# Patient Record
Sex: Male | Born: 1990 | Race: Black or African American | Hispanic: No | Marital: Single | Smoking: Former smoker
Health system: Southern US, Community
[De-identification: ages and names within clinical notes are randomized; demographics above are authoritative.]

## PROBLEM LIST (undated history)

## (undated) DIAGNOSIS — F419 Anxiety disorder, unspecified: Secondary | ICD-10-CM

## (undated) DIAGNOSIS — R002 Palpitations: Secondary | ICD-10-CM

## (undated) DIAGNOSIS — G4733 Obstructive sleep apnea (adult) (pediatric): Secondary | ICD-10-CM

---

## 2016-01-06 DIAGNOSIS — R002 Palpitations: Secondary | ICD-10-CM

## 2016-01-06 HISTORY — DX: Palpitations: R00.2

## 2016-01-23 ENCOUNTER — Observation Stay (HOSPITAL_COMMUNITY)
Admission: EM | Admit: 2016-01-23 | Discharge: 2016-01-26 | Disposition: A | Discharge status: Home / Self Care | Payer: BLUE CROSS/BLUE SHIELD | Attending: Internal Medicine | Admitting: Internal Medicine

## 2016-01-23 ENCOUNTER — Encounter (HOSPITAL_COMMUNITY): Payer: Self-pay | Admitting: *Deleted

## 2016-01-23 DIAGNOSIS — F419 Anxiety disorder, unspecified: Secondary | ICD-10-CM | POA: Diagnosis present

## 2016-01-23 DIAGNOSIS — R002 Palpitations: Secondary | ICD-10-CM | POA: Diagnosis present

## 2016-01-23 DIAGNOSIS — R55 Syncope and collapse: Secondary | ICD-10-CM | POA: Diagnosis not present

## 2016-01-23 DIAGNOSIS — R079 Chest pain, unspecified: Secondary | ICD-10-CM | POA: Diagnosis present

## 2016-01-23 DIAGNOSIS — N289 Disorder of kidney and ureter, unspecified: Secondary | ICD-10-CM | POA: Diagnosis not present

## 2016-01-23 DIAGNOSIS — R110 Nausea: Secondary | ICD-10-CM | POA: Diagnosis not present

## 2016-01-23 DIAGNOSIS — Z87891 Personal history of nicotine dependence: Secondary | ICD-10-CM | POA: Diagnosis not present

## 2016-01-23 DIAGNOSIS — N179 Acute kidney failure, unspecified: Secondary | ICD-10-CM | POA: Insufficient documentation

## 2016-01-23 DIAGNOSIS — R42 Dizziness and giddiness: Secondary | ICD-10-CM

## 2016-01-23 HISTORY — DX: Palpitations: R00.2

## 2016-01-23 HISTORY — DX: Anxiety disorder, unspecified: F41.9

## 2016-01-23 HISTORY — DX: Obstructive sleep apnea (adult) (pediatric): G47.33

## 2016-01-23 NOTE — ED Provider Notes (Signed)
CSN: [id number]     Arrival date & time 01/23/16  2332 History  By signing my name below, I, Brendan Martinez, attest that this documentation has been prepared under the direction and in the presence of Zadie Rhine, MD. Electronically Signed: Budd Martinez, ED Scribe. 01/24/2016. 12:39 AM.    Chief Complaint  Patient presents with  . Chest Pain   Patient is a 25 y.o. male presenting with chest pain. The history is provided by the patient and medical records. No language interpreter was used.  Chest Pain Pain quality: pressure   Timing:  Constant Chronicity:  New Context: movement   Relieved by:  None tried Worsened by:  Nothing tried Ineffective treatments:  None tried Associated symptoms: altered mental status, diaphoresis, headache, nausea, palpitations, syncope and weakness   Syncope:    Witnessed: yes   Risk factors: male sex    HPI Comments: Level 5 Caveat (AMS) 12:13 AM - Per lab tech, pt was acting normally until he had a syncopal episode just before she was about to collect blood for diagnostic studies. She states that pt responded to sternal rub and regained consciousness. When asked what his birth date was, he simply responded with his first name multiple times, then lost consciousness again.   Brendan Martinez is a 25 y.o. male former smoker with a PMHx of anxiety and heart palpitations who presents to the Emergency Department complaining of chest pain onset tonight. 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 He has had a headache all day, and tonight when he stood up from his desk, he began to feel weak, having palpitations, chest pressure, diaphoresis, and nausea.    Past Medical History  Diagnosis Date  . Anxiety   . Heart palpitations    History reviewed. No pertinent past surgical history. History reviewed. No pertinent family history. Social History  Substance Use Topics  . Smoking status: Former  Games developer  . Smokeless tobacco: None  . Alcohol Use: No    Review of Systems  Unable to perform ROS: Mental status change  Constitutional: Positive for diaphoresis.  Cardiovascular: Positive for chest pain, palpitations and syncope.  Gastrointestinal: Positive for nausea.  Neurological: Positive for syncope, weakness and headaches.    Allergies  Review of patient's allergies indicates no known allergies.  Home Medications   Prior to Admission medications   Not on File   BP 161/84 mmHg  Pulse 99  Temp(Src) 98.2 F (36.8 C) (Oral)  Resp 21  Ht  (1.854 m)  Wt 277 lb (125.646 kg)  BMI 36.55 kg/m2  SpO2 96% Physical Exam CONSTITUTIONAL: ill appearing HEAD: Normocephalic/atraumatic EYES: PERRL ENMT: Mucous membranes moist NECK: supple no meningeal signs SPINE/BACK:entire spine nontender CV: S1/S2 noted, no murmurs/rubs/gallops noted LUNGS: Lungs are clear to auscultation bilaterally, no apparent distress ABDOMEN: soft, nontender NEURO: Pt is breathing spontaneously, however, he does not respond to voice or pain.  He does not respond to sternal rub EXTREMITIES: pulses normal/equal SKIN: warm, color normal PSYCH: unable to asess  ED Course  Procedures  DIAGNOSTIC STUDIES: Oxygen Saturation is 97% on RA, adequate by my interpretation.    COORDINATION OF CARE:  12:18 AM - Discussed plans to order diagnostic studies. Pt advised of plan for treatment and pt agrees. Pt with episode of unresponsiveness Repeat EKG unchanged Unclear cause, however vitals currently appropriate I reviewed tele monitor and no abnormal rhythm noted Will follow closely 12:57  AM Pt more alert He is resting with eyes closed, but will answer questions in a soft voice D/w parents who have arrived They report he has had this before, diagnosed with panic attacks due to stressful job He does not have known cardiac history 2:18 AM Pt awake/alert He does not recall any details from when he was  unresponsive in the ED He reports h/o this in past, though this was longest episode He reports 3 yrs ago while in [place], he had an episode, had a stress test and had echo and was told right side of heart "enlarged" He does appear to have cardiomegaly on today's CXR Will admit for further workup He may benefit from echocardiogram Workup in the ED largely reassuring D/w dr Lovell Sheehan for tele OBS admission BP 140/85 mmHg  Pulse 88  Temp(Src) 98.2 F (36.8 C) (Oral)  Resp 18  Ht  (1.854 m)  Wt 125.646 kg  BMI 36.55 kg/m2  SpO2 96%   Labs Review Labs Reviewed  CBC WITH DIFFERENTIAL/PLATELET - Abnormal; Notable for the following:    WBC 3.7 (*)    Neutro Abs 1.0 (*)    All other components within normal limits  COMPREHENSIVE METABOLIC PANEL - Abnormal; Notable for the following:    Sodium 134 (*)    Chloride 98 (*)    Creatinine, Ser 1.34 (*)    Calcium 8.4 (*)    All other components within normal limits  I-STAT CHEM 8, ED - Abnormal; Notable for the following:    Chloride 95 (*)    Creatinine, Ser 1.40 (*)    All other components within normal limits  TROPONIN I  ETHANOL  URINE RAPID DRUG SCREEN, HOSP PERFORMED  CBG MONITORING, ED  I-STAT TROPOININ, ED  I-STAT CG4 LACTIC ACID, ED    Imaging Review Ct Head Wo Contrast  01/24/2016  CLINICAL DATA:  Headache all day, weakness upon standing. EXAM: CT HEAD WITHOUT CONTRAST TECHNIQUE: Contiguous axial images were obtained from the base of the skull through the vertex without intravenous contrast. COMPARISON:  None. FINDINGS: There is no intracranial hemorrhage, mass or evidence of acute infarction. There is no extra-axial fluid collection. Gray matter and white matter appear normal. Cerebral volume is normal for age. Brainstem and posterior fossa are unremarkable. The CSF spaces appear normal. The bony structures are intact. There is mild membrane thickening in the ethmoid air cells. Remainder of the visible sinuses are  clear. IMPRESSION: Normal brain Electronically Signed   By: Ellery Plunk M.D.   On: 01/24/2016 01:10   Dg Chest Portable 1 View  01/24/2016  CLINICAL DATA:  Acute onset of generalized weakness and altered level of consciousness. Patient unresponsive. Initial encounter. EXAM: PORTABLE CHEST 1 VIEW COMPARISON:  None. FINDINGS: The lungs are well-aerated and clear. There is no evidence of focal opacification, pleural effusion or pneumothorax. The cardiomediastinal silhouette is borderline normal in size. No acute osseous abnormalities are seen. IMPRESSION: No acute cardiopulmonary process seen. Electronically Signed   By: Roanna Raider M.D.   On: 01/24/2016 00:42   I have personally reviewed and evaluated these images and lab results as part of my medical decision-making.   EKG Interpretation   Date/Time:  Sunday January 24 2016 00:11:50 EDT Ventricular Rate:  102 PR Interval:  164 QRS Duration: 97 QT Interval:  332 QTC Calculation: 432 R Axis:   -21 Text Interpretation:  Sinus tachycardia Probable left atrial enlargement  Left ventricular hypertrophy No significant change since last tracing  Confirmed by Bebe ShaggyWICKLINE  MD, Lama Narayanan ([id number]) on 01/24/2016 12:16:36 AM      MDM   Final diagnoses:  Syncope, unspecified syncope type  Renal insufficiency    Nursing notes including past medical history and social history reviewed and considered in documentation xrays/imaging reviewed by myself and considered during evaluation Labs/vital reviewed myself and considered during evaluation   I personally performed the services described in this documentation, which was scribed in my presence. The recorded information has been reviewed and is accurate.       Zadie Rhineonald Jaclynne Baldo, MD 01/24/16 [id number]

## 2016-01-23 NOTE — ED Notes (Addendum)
Pt c/o headache earlier today and tonight he got up from his desk, pt began feeling weak, having heart palpitations, chest pressure, felt diaphoretic, and nauseated. Pt is being treated for anxiety and palpitations but can not take these medications at work.

## 2016-01-24 ENCOUNTER — Observation Stay (HOSPITAL_BASED_OUTPATIENT_CLINIC_OR_DEPARTMENT_OTHER): Payer: BLUE CROSS/BLUE SHIELD

## 2016-01-24 ENCOUNTER — Emergency Department (HOSPITAL_COMMUNITY): Payer: BLUE CROSS/BLUE SHIELD

## 2016-01-24 DIAGNOSIS — R55 Syncope and collapse: Secondary | ICD-10-CM | POA: Diagnosis present

## 2016-01-24 DIAGNOSIS — N179 Acute kidney failure, unspecified: Secondary | ICD-10-CM | POA: Insufficient documentation

## 2016-01-24 DIAGNOSIS — F419 Anxiety disorder, unspecified: Secondary | ICD-10-CM | POA: Diagnosis present

## 2016-01-24 DIAGNOSIS — N289 Disorder of kidney and ureter, unspecified: Secondary | ICD-10-CM

## 2016-01-24 DIAGNOSIS — R072 Precordial pain: Secondary | ICD-10-CM

## 2016-01-24 DIAGNOSIS — R002 Palpitations: Secondary | ICD-10-CM | POA: Diagnosis not present

## 2016-01-24 DIAGNOSIS — R079 Chest pain, unspecified: Secondary | ICD-10-CM | POA: Diagnosis present

## 2016-01-24 DIAGNOSIS — R0789 Other chest pain: Secondary | ICD-10-CM | POA: Diagnosis not present

## 2016-01-24 LAB — CBC
HCT: 43.3 % (ref 39.0–52.0)
Hemoglobin: 14.6 g/dL (ref 13.0–17.0)
MCH: 29.3 pg (ref 26.0–34.0)
MCHC: 33.7 g/dL (ref 30.0–36.0)
MCV: 86.8 fL (ref 78.0–100.0)
PLATELETS: 201 10*3/uL (ref 150–400)
RBC: 4.99 MIL/uL (ref 4.22–5.81)
RDW: 12.5 % (ref 11.5–15.5)
WBC: 3.5 10*3/uL — ABNORMAL LOW (ref 4.0–10.5)

## 2016-01-24 LAB — COMPREHENSIVE METABOLIC PANEL
ALT: 30 U/L (ref 17–63)
AST: 39 U/L (ref 15–41)
Albumin: 3.9 g/dL (ref 3.5–5.0)
Alkaline Phosphatase: 65 U/L (ref 38–126)
Anion gap: 7 (ref 5–15)
BILIRUBIN TOTAL: 1.1 mg/dL (ref 0.3–1.2)
BUN: 16 mg/dL (ref 6–20)
CHLORIDE: 98 mmol/L — AB (ref 101–111)
CO2: 29 mmol/L (ref 22–32)
Calcium: 8.4 mg/dL — ABNORMAL LOW (ref 8.9–10.3)
Creatinine, Ser: 1.34 mg/dL — ABNORMAL HIGH (ref 0.61–1.24)
Glucose, Bld: 96 mg/dL (ref 65–99)
POTASSIUM: 3.6 mmol/L (ref 3.5–5.1)
Sodium: 134 mmol/L — ABNORMAL LOW (ref 135–145)
TOTAL PROTEIN: 7.7 g/dL (ref 6.5–8.1)

## 2016-01-24 LAB — I-STAT CHEM 8, ED
BUN: 15 mg/dL (ref 6–20)
CREATININE: 1.4 mg/dL — AB (ref 0.61–1.24)
Calcium, Ion: 1.12 mmol/L (ref 1.12–1.23)
Chloride: 95 mmol/L — ABNORMAL LOW (ref 101–111)
Glucose, Bld: 94 mg/dL (ref 65–99)
HEMATOCRIT: 47 % (ref 39.0–52.0)
HEMOGLOBIN: 16 g/dL (ref 13.0–17.0)
POTASSIUM: 3.6 mmol/L (ref 3.5–5.1)
SODIUM: 137 mmol/L (ref 135–145)
TCO2: 27 mmol/L (ref 0–100)

## 2016-01-24 LAB — RAPID URINE DRUG SCREEN, HOSP PERFORMED
Amphetamines: NOT DETECTED
BARBITURATES: NOT DETECTED
BENZODIAZEPINES: NOT DETECTED
COCAINE: NOT DETECTED
Opiates: NOT DETECTED
Tetrahydrocannabinol: NOT DETECTED

## 2016-01-24 LAB — BASIC METABOLIC PANEL
Anion gap: 7 (ref 5–15)
BUN: 14 mg/dL (ref 6–20)
CALCIUM: 8.6 mg/dL — AB (ref 8.9–10.3)
CO2: 27 mmol/L (ref 22–32)
CREATININE: 1.13 mg/dL (ref 0.61–1.24)
Chloride: 100 mmol/L — ABNORMAL LOW (ref 101–111)
GFR calc non Af Amer: >60 mL/min (ref >60)
GLUCOSE: 90 mg/dL (ref 65–99)
Potassium: 3.8 mmol/L (ref 3.5–5.1)
Sodium: 134 mmol/L — ABNORMAL LOW (ref 135–145)

## 2016-01-24 LAB — CBG MONITORING, ED: GLUCOSE-CAPILLARY: 89 mg/dL (ref 65–99)

## 2016-01-24 LAB — TSH: TSH: 2.101 u[IU]/mL (ref 0.350–4.500)

## 2016-01-24 LAB — CBC WITH DIFFERENTIAL/PLATELET
Basophils Absolute: 0 10*3/uL (ref 0.0–0.1)
Basophils Relative: 0 %
EOS ABS: 0 10*3/uL (ref 0.0–0.7)
Eosinophils Relative: 0 %
HCT: 42.9 % (ref 39.0–52.0)
Hemoglobin: 14.4 g/dL (ref 13.0–17.0)
LYMPHS ABS: 2.1 10*3/uL (ref 0.7–4.0)
LYMPHS PCT: 56 %
MCH: 29.3 pg (ref 26.0–34.0)
MCHC: 33.6 g/dL (ref 30.0–36.0)
MCV: 87.4 fL (ref 78.0–100.0)
MONO ABS: 0.6 10*3/uL (ref 0.1–1.0)
MONOS PCT: 17 %
Neutro Abs: 1 10*3/uL — ABNORMAL LOW (ref 1.7–7.7)
Neutrophils Relative %: 27 %
PLATELETS: 195 10*3/uL (ref 150–400)
RBC: 4.91 MIL/uL (ref 4.22–5.81)
RDW: 12.5 % (ref 11.5–15.5)
WBC: 3.7 10*3/uL — AB (ref 4.0–10.5)

## 2016-01-24 LAB — TROPONIN I
Troponin I: <0.03 ng/mL (ref <0.031)
Troponin I: <0.03 ng/mL (ref <0.031)

## 2016-01-24 LAB — I-STAT TROPONIN, ED: TROPONIN I, POC: 0 ng/mL (ref 0.00–0.08)

## 2016-01-24 LAB — ECHOCARDIOGRAM COMPLETE
HEIGHTINCHES: 73 in
WEIGHTICAEL: 4352 [oz_av]

## 2016-01-24 LAB — I-STAT CG4 LACTIC ACID, ED: Lactic Acid, Venous: 0.75 mmol/L (ref 0.5–2.0)

## 2016-01-24 LAB — ETHANOL

## 2016-01-24 MED ORDER — ENOXAPARIN SODIUM 40 MG/0.4ML ~~LOC~~ SOLN
40.0000 mg | SUBCUTANEOUS | Status: DC
  Administered 2016-01-25 (×2): 40 mg via SUBCUTANEOUS
  Filled 2016-01-24 (×3): qty 0.4

## 2016-01-24 MED ORDER — ACETAMINOPHEN 650 MG RE SUPP: 650.0000 mg | Freq: Four times a day (QID) | RECTAL | Status: DC | PRN

## 2016-01-24 MED ORDER — OXYCODONE HCL 5 MG PO TABS: 5.0000 mg | ORAL_TABLET | ORAL | Status: DC | PRN

## 2016-01-24 MED ORDER — ALPRAZOLAM 1 MG PO TABS
1.0000 mg | ORAL_TABLET | Freq: Three times a day (TID) | ORAL | Status: DC | PRN
  Administered 2016-01-24: 1 mg via ORAL
  Filled 2016-01-24: qty 1

## 2016-01-24 MED ORDER — SODIUM CHLORIDE 0.9% FLUSH
3.0000 mL | Freq: Two times a day (BID) | INTRAVENOUS | Status: DC
  Administered 2016-01-24 – 2016-01-26 (×4): 3 mL via INTRAVENOUS

## 2016-01-24 MED ORDER — ONDANSETRON HCL 4 MG PO TABS: 4.0000 mg | ORAL_TABLET | Freq: Four times a day (QID) | ORAL | Status: DC | PRN

## 2016-01-24 MED ORDER — HYDROMORPHONE HCL 1 MG/ML IJ SOLN: 0.5000 mg | INTRAMUSCULAR | Status: DC | PRN

## 2016-01-24 MED ORDER — ALUM & MAG HYDROXIDE-SIMETH 200-200-20 MG/5ML PO SUSP: 30.0000 mL | Freq: Four times a day (QID) | ORAL | Status: DC | PRN

## 2016-01-24 MED ORDER — SODIUM CHLORIDE 0.9 % IV SOLN
INTRAVENOUS | Status: AC
  Administered 2016-01-24: 04:00:00 via INTRAVENOUS

## 2016-01-24 MED ORDER — ACETAMINOPHEN 325 MG PO TABS: 650.0000 mg | ORAL_TABLET | Freq: Four times a day (QID) | ORAL | Status: DC | PRN

## 2016-01-24 MED ORDER — IBUPROFEN 400 MG PO TABS: 400.0000 mg | ORAL_TABLET | Freq: Four times a day (QID) | ORAL | Status: DC | PRN

## 2016-01-24 MED ORDER — ONDANSETRON HCL 4 MG/2ML IJ SOLN: 4.0000 mg | Freq: Four times a day (QID) | INTRAMUSCULAR | Status: DC | PRN

## 2016-01-24 NOTE — H&P (Addendum)
[facility] Admission History and Physical       Brendan Martinez ZOX:[id number] DOB: 02-24-1991 DOA: 01/23/2016  Referring physician: EDP PCP: No primary care provider on file.  Specialists:   Chief Complaint: Passed Out  HPI: Brendan Martinez is a 25 y.o. male who was brought to the ED from work after having a syncopal episode.   He reports feeling having a headache and palpitations then beginning to have chest pain and then blacked out.   He suffered another episode in the ED that was witnessed by staff, and no arrhythmias were captured on the monitor at that time.   The EDP reported that he was passed out and unresponsive for a few minutes.    He was not confused afterward.   His initial workup in the ED was negative.    Of Note:  Patient reports having 2 previous episodes of syncope, the first in college in 2013 and he had a Stress test at that time and was told he had a thickened right Ventricle, and he reports that he was told to stop lifting weights.   He denies nay medical problems but he does take Paxil for Anxiety/Panic Attacks.      Review of Systems:  Constitutional: No Weight Loss, No Weight Gain, Night Sweats, Fevers, Chills, Dizziness, Light Headedness, Fatigue, or Generalized Weakness HEENT: +Headache, Difficulty Swallowing,Tooth/Dental Problems,Sore Throat,  No Sneezing, Rhinitis, Ear Ache, Nasal Congestion, or Post Nasal Drip,  Cardio-vascular: +Chest pain, Orthopnea, PND, Edema in Lower Extremities, Anasarca, Dizziness, +Palpitations  Resp: No Dyspnea, No DOE, No Productive Cough, No Non-Productive Cough, No Hemoptysis, No Wheezing.    GI: No Heartburn, Indigestion, Abdominal Pain, Nausea, Vomiting, Diarrhea, Constipation, Hematemesis, Hematochezia, Melena, Change in Bowel Habits,  Loss of Appetite  GU: No Dysuria, No Change in Color of Urine, No Urgency or Urinary Frequency, No Flank pain.  Musculoskeletal: No Joint Pain or Swelling, No Decreased Range of Motion, No Back  Pain.  Neurologic:    +Syncope, No Seizures, Muscle Weakness, Paresthesia, Vision Disturbance or Loss, No Diplopia, No Vertigo, No Difficulty Walking,  Skin: No Rash or Lesions. Psych: No Change in Mood or Affect, No Depression or Anxiety, No Memory loss, No Confusion, or Hallucinations   Past Medical History  Diagnosis Date  . Anxiety   . Heart palpitations      History reviewed. No pertinent past surgical history.    Prior to Admission medications   Not on File     No Known Allergies  Social History:   Works as a Corporate treasurer for [employer]  reports that he has quit smoking. He does not have any smokeless tobacco history on file. He reports that he does not drink alcohol or use illicit drugs.     History reviewed. No pertinent family history.     Physical Exam:  GEN:  Pleasant Well Nourished and Well Developed 25 y.o. male examined and in no acute distress; cooperative with exam Filed Vitals:   01/24/16 0130 01/24/16 0145 01/24/16 0200 01/24/16 0215  BP: 148/86 139/91 134/75 140/85  Pulse: 91 87 87 88  Temp:      TempSrc:      Resp: Height:      Weight:      SpO2: 96% 96% 97% 96%   Blood pressure 140/85, pulse 88, temperature 98.2 F (36.8 C), temperature source Oral, resp. rate 18, height  (1.854 m), weight 125.646 kg (277 lb), SpO2 96 %. PSYCH: He  is alert and oriented x4; does not appear anxious does not appear depressed; affect is normal HEENT: Normocephalic and Atraumatic, Mucous membranes pink; PERRLA; EOM intact; Fundi:  Benign;  No scleral icterus, Nares: Patent, Oropharynx: Clear, Edentulous or Fair Dentition,    Neck:  FROM, No Cervical Lymphadenopathy nor Thyromegaly or Carotid Bruit; No JVD; Breasts:: Not examined CHEST WALL: No tenderness CHEST: Normal respiration, clear to auscultation bilaterally HEART: Regular rate and rhythm; no murmurs rubs or gallops BACK: No kyphosis or scoliosis; No CVA tenderness ABDOMEN:  Positive Bowel Sounds, Soft Non-Tender, No Rebound or Guarding; No Masses, No Organomegaly Rectal Exam: Not done EXTREMITIES: No Cyanosis, Clubbing, or Edema; No Ulcerations. Genitalia: not examined PULSES: 2+ and symmetric SKIN: Normal hydration no rash or ulceration CNS:  Alert and Oriented x 4, No Focal Deficits Vascular: pulses palpable throughout    Labs on Admission:  Basic Metabolic Panel:  Recent Labs Lab 01/24/16 0003 01/24/16 0016  NA 134* 137  K 3.6 3.6  CL 98* 95*  CO2 29  --   GLUCOSE 96 94  BUN 16 15  CREATININE 1.34* 1.40*  CALCIUM 8.4*  --    Liver Function Tests:  Recent Labs Lab 01/24/16 0003  AST 39  ALT 30  ALKPHOS 65  BILITOT 1.1  PROT 7.7  ALBUMIN 3.9   No results for input(s): LIPASE, AMYLASE in the last 168 hours. No results for input(s): AMMONIA in the last 168 hours. CBC:  Recent Labs Lab 01/24/16 0003 01/24/16 0016  WBC 3.7*  --   NEUTROABS 1.0*  --   HGB 14.4 16.0  HCT 42.9 47.0  MCV 87.4  --   PLT 195  --    Cardiac Enzymes:  Recent Labs Lab 01/24/16 0003  TROPONINI <0.03    BNP (last 3 results) No results for input(s): BNP in the last 8760 hours.  ProBNP (last 3 results) No results for input(s): PROBNP in the last 8760 hours.  CBG:  Recent Labs Lab 01/24/16 0018  GLUCAP 89    Radiological Exams on Admission: Ct Head Wo Contrast  01/24/2016  CLINICAL DATA:  Headache all day, weakness upon standing. EXAM: CT HEAD WITHOUT CONTRAST TECHNIQUE: Contiguous axial images were obtained from the base of the skull through the vertex without intravenous contrast. COMPARISON:  None. FINDINGS: There is no intracranial hemorrhage, mass or evidence of acute infarction. There is no extra-axial fluid collection. Gray matter and white matter appear normal. Cerebral volume is normal for age. Brainstem and posterior fossa are unremarkable. The CSF spaces appear normal. The bony structures are intact. There is mild membrane  thickening in the ethmoid air cells. Remainder of the visible sinuses are clear. IMPRESSION: Normal brain Electronically Signed   By: Ellery Plunkaniel R Mitchell M.D.   On: 01/24/2016 01:10   Dg Chest Portable 1 View  01/24/2016  CLINICAL DATA:  Acute onset of generalized weakness and altered level of consciousness. Patient unresponsive. Initial encounter. EXAM: PORTABLE CHEST 1 VIEW COMPARISON:  None. FINDINGS: The lungs are well-aerated and clear. There is no evidence of focal opacification, pleural effusion or pneumothorax. The cardiomediastinal silhouette is borderline normal in size. No acute osseous abnormalities are seen. IMPRESSION: No acute cardiopulmonary process seen. Electronically Signed   By: Roanna RaiderJeffery  Chang M.D.   On: 01/24/2016 00:42     EKG: Independently reviewed. Sinus Tachycardia rate = 103, + LVH Changes        Assessment/Plan:     25 y.o. male with  Principal Problem:  Syncope   Telemetry Monitoring   Check Orthostatics   2D ECHO in AM   CARDs consult in AM     Consider Stress Testing      Active Problems:    Palpitations   Cardiac Monitoring   Check TSH       Chest pain   Cardiac Monitoring   Cycle Troponins    Renal Insufficiency   Gentle IVFs   Monitor BUN/Cr      Anxiety   Continue Paxil Rx    DVT Prophylaxis   Lovenox    Code Status:     FULL CODE        Family Communication:   Both Parents at Bedside    Disposition Plan:   Observation Status with Expected LOS 1-2 days  Time spent:  70 Minutes      Pepper Wyndham C [facility] Pager [phone]   If 7AM -7PM Please Contact the Day Rounding Team MD for [facility]  If 7PM-7AM, Please Contact Night-Floor Coverage  [email or web address] Password [email or web address] 01/24/2016, 2:46 AM     ADDENDUM:   Patient was seen and examined on 01/24/2016

## 2016-01-24 NOTE — ED Notes (Signed)
Family in with pt. Family updated on pt's status.

## 2016-01-24 NOTE — Progress Notes (Signed)
[facility] PROGRESS NOTE    Progress Note   Brendan Martinez RUE:[id number] DOB: [birth year] DOA: 01/23/2016 PCP: No primary care provider on file.   Brief Narrative:   Brendan Martinez is an 25 y.o. male no significant past medical history that comes in for syncopal episode.  Assessment/Plan:   Syncope/Palpitations Twelve-lead EKG shows sinus rhythm with T-wave inversions likely due to LVH. Cardiac biomarkers are negative 3. I have personally reviewed the chest x-ray shows no acute findings. TSH is 2.1. Orthostatics were negative after IV fluid hydration. 2-D echo is pending, I think his syncopal episode was likely an episode of orthostatic hypotension.  Acute kidney injury: Likely  prerenal in etiology resolved with IV fluid hydration.  Anxiety: Benzodiazepines patient is almost comatose, As he got Xanax in the morning.     DVT Prophylaxis - Lovenox ordered.  Family Communication: none Disposition Plan: Home 1-2 days Code Status:     Code Status Orders        Start     Ordered   01/24/16 0315  Full code   Continuous     01/24/16 0315    Code Status History    Date Active Date Inactive Code Status Order ID Comments User Context   This patient has a current code status but no historical code status.        IV Access:    Peripheral IV   Procedures and diagnostic studies:   Ct Head Wo Contrast  01/24/2016  CLINICAL DATA:  Headache all day, weakness upon standing. EXAM: CT HEAD WITHOUT CONTRAST TECHNIQUE: Contiguous axial images were obtained from the base of the skull through the vertex without intravenous contrast. COMPARISON:  None. FINDINGS: There is no intracranial hemorrhage, mass or evidence of acute infarction. There is no extra-axial fluid collection. Gray matter and white matter appear normal. Cerebral volume is normal for age. Brainstem and posterior fossa are unremarkable. The CSF spaces appear normal. The bony structures are intact. There is mild  membrane thickening in the ethmoid air cells. Remainder of the visible sinuses are clear. IMPRESSION: Normal brain Electronically Signed   By: Ellery Plunkaniel R Mitchell M.D.   On: 01/24/2016 01:10   Dg Chest Portable 1 View  01/24/2016  CLINICAL DATA:  Acute onset of generalized weakness and altered level of consciousness. Patient unresponsive. Initial encounter. EXAM: PORTABLE CHEST 1 VIEW COMPARISON:  None. FINDINGS: The lungs are well-aerated and clear. There is no evidence of focal opacification, pleural effusion or pneumothorax. The cardiomediastinal silhouette is borderline normal in size. No acute osseous abnormalities are seen. IMPRESSION: No acute cardiopulmonary process seen. Electronically Signed   By: Roanna RaiderJeffery  Chang M.D.   On: 01/24/2016 00:42     Medical Consultants:    None.  Anti-Infectives:   Anti-infectives    None      Subjective:    Brendan Martinez unable to provide a history as a patient is heavily sedated.  Objective:    Filed Vitals:   01/24/16 0245 01/24/16 0315 01/24/16 0322 01/24/16 0805  BP: 138/83  125/69 124/67  Pulse: 80  82 68  Temp:   98.9 F (37.2 C) 98.1 F (36.7 C)  TempSrc:   Oral Oral  Resp: 19  18 16   Height:   6\' 1"  (1.854 m)   Weight:   123.378 kg (272 lb)   SpO2: 95% 95% 95% 95%    Intake/Output Summary (Last 24 hours) at 01/24/16 0957 Last data filed at 01/24/16 0924  Gross per 24 hour  Intake    240 ml  Output      0 ml  Net    240 ml   Filed Weights   01/23/16 2340 01/24/16 0322  Weight: 125.646 kg (277 lb) 123.378 kg (272 lb)    Exam: Gen:  NAD, heavily sedated unable to provide history. Cardiovascular:  RRR. Chest and lungs:   CTAB Abdomen:  Abdomen soft, NT/ND, + BS Extremities:  No edema   Data Reviewed:    Labs: Basic Metabolic Panel:  Recent Labs Lab 01/24/16 0003 01/24/16 0016 01/24/16 0403  NA 134* 137 134*  K 3.6 3.6 3.8  CL 98* 95* 100*  CO2 29  --  27  GLUCOSE 96 94 90  BUN CREATININE  1.34* 1.40* 1.13  CALCIUM 8.4*  --  8.6*   GFR Estimated Creatinine Clearance: 137.5 mL/min (by C-G formula based on Cr of 1.13). Liver Function Tests:  Recent Labs Lab 01/24/16 0003  AST 39  ALT 30  ALKPHOS 65  BILITOT 1.1  PROT 7.7  ALBUMIN 3.9   No results for input(s): LIPASE, AMYLASE in the last 168 hours. No results for input(s): AMMONIA in the last 168 hours. Coagulation profile No results for input(s): INR, PROTIME in the last 168 hours.  CBC:  Recent Labs Lab 01/24/16 0003 01/24/16 0016 01/24/16 0403  WBC 3.7*  --  3.5*  NEUTROABS 1.0*  --   --   HGB 14.4 16.0 14.6  HCT 42.9 47.0 43.3  MCV 87.4  --  86.8  PLT 195  --  201   Cardiac Enzymes:  Recent Labs Lab 01/24/16 0003 01/24/16 0411  TROPONINI <0.03 <0.03   BNP (last 3 results) No results for input(s): PROBNP in the last 8760 hours. CBG:  Recent Labs Lab 01/24/16 0018  GLUCAP 89   D-Dimer: No results for input(s): DDIMER in the last 72 hours. Hgb A1c: No results for input(s): HGBA1C in the last 72 hours. Lipid Profile: No results for input(s): CHOL, HDL, LDLCALC, TRIG, CHOLHDL, LDLDIRECT in the last 72 hours. Thyroid function studies:  Recent Labs  01/24/16 0403  TSH 2.101   Anemia work up: No results for input(s): VITAMINB12, FOLATE, FERRITIN, TIBC, IRON, RETICCTPCT in the last 72 hours. Sepsis Labs:  Recent Labs Lab 01/24/16 0003 01/24/16 0020 01/24/16 0403  WBC 3.7*  --  3.5*  LATICACIDVEN  --  0.75  --    Microbiology No results found for this or any previous visit (from the past 240 hour(s)).   Medications:   . enoxaparin (LOVENOX) injection  40 mg Subcutaneous Q24H  . sodium chloride flush  3 mL Intravenous Q12H   Continuous Infusions: . sodium chloride 100 mL/hr at 01/24/16 0405    Time spent: 25 min     FELIZ Rosine Beat  [facility] Pager [phone]  *Please refer to [email or web address], password [email or web address] to get updated schedule on who will round on this  patient, as hospitalists switch teams weekly. If 7PM-7AM, please contact night-coverage at [email or web address], password [email or web address] for any overnight needs.  01/24/2016, 9:57 AM

## 2016-01-24 NOTE — Progress Notes (Signed)
*  PRELIMINARY RESULTS* Echocardiogram 2D Echocardiogram has been performed.  Bjorn Hallas H Chayden Garrelts 01/24/2016, 10:40 AM 

## 2016-01-24 NOTE — ED Notes (Signed)
Hospitalist in with pt

## 2016-01-24 NOTE — Progress Notes (Signed)
[id number] Patient noted anxious and c/o heart racing. Patient takes Xanax at home for anxiety. No current order for anti-anxiety medication noted at this time. MD notified and order for anti-anxiety medication requested.

## 2016-01-24 NOTE — ED Notes (Signed)
Pt stating, "My heart, I'm fading." "My heart is stopping." I assured pt that we were monitoring him and his heart had not stopped. Pt feeling weak and still diaphoretic. Cold wash cloth placed on pt's forehead.

## 2016-01-24 NOTE — ED Notes (Signed)
Family at bedside. 

## 2016-01-24 NOTE — ED Notes (Signed)
Pt sitting up in bed, alert, awake, speaking clearly. Pt has no complaints at this time. Pt does ask if Sgt. Stultz calls again to check on him, to direct the phone call to his room.

## 2016-01-24 NOTE — ED Notes (Signed)
Lab tech Garfield CorneaGwyn was in room drawing pt's blood and called out for help. Upon entry to room pt was mildy responsive to sternal rub and was diaphoretic. EDP wickline was called to room.

## 2016-01-24 NOTE — Progress Notes (Signed)
Patient began having a panic attack according to his family. He was trembling and hyperventilating in his bed and stated, "I'm going to pass out," multiple times. Heart rate was in the 120's. Paged Dr. Robb Matarrtiz.  Since the patient had been comatose after taking a xanax this morning around 5:30am, MD decided to continue monitoring pt and not to give another dose of xanax.  Continuing to monitor vitals.

## 2016-01-25 ENCOUNTER — Observation Stay (HOSPITAL_COMMUNITY): Payer: BLUE CROSS/BLUE SHIELD

## 2016-01-25 ENCOUNTER — Encounter (HOSPITAL_COMMUNITY): Payer: Self-pay | Admitting: Adult Health

## 2016-01-25 DIAGNOSIS — R0789 Other chest pain: Secondary | ICD-10-CM

## 2016-01-25 DIAGNOSIS — N179 Acute kidney failure, unspecified: Secondary | ICD-10-CM

## 2016-01-25 DIAGNOSIS — R002 Palpitations: Secondary | ICD-10-CM

## 2016-01-25 DIAGNOSIS — F419 Anxiety disorder, unspecified: Secondary | ICD-10-CM

## 2016-01-25 DIAGNOSIS — R55 Syncope and collapse: Secondary | ICD-10-CM

## 2016-01-25 MED ORDER — LORAZEPAM 1 MG PO TABS
1.0000 mg | ORAL_TABLET | Freq: Once | ORAL | Status: AC | PRN
  Administered 2016-01-26: 1 mg via ORAL
  Filled 2016-01-25: qty 1

## 2016-01-25 MED ORDER — METOPROLOL TARTRATE 25 MG PO TABS
12.5000 mg | ORAL_TABLET | Freq: Two times a day (BID) | ORAL | Status: DC
  Administered 2016-01-25 – 2016-01-26 (×2): 12.5 mg via ORAL
  Filled 2016-01-25 (×2): qty 1

## 2016-01-25 MED ORDER — POTASSIUM CHLORIDE IN NACL 20-0.9 MEQ/L-% IV SOLN
INTRAVENOUS | Status: AC
  Administered 2016-01-25: 15:00:00 via INTRAVENOUS

## 2016-01-25 MED ORDER — SODIUM CHLORIDE 0.9 % IV SOLN: INTRAVENOUS | Status: DC

## 2016-01-25 NOTE — Consult Note (Signed)
CARDIOLOGY CONSULT NOTE   Patient ID: Brendan Martinez MRN: [id number] DOB/AGE: 01-Sep-1991 24 y.o.  Admit Date: 01/23/2016 Referring Physician: TRH=Buriev MD Primary Physician: No primary care provider on file. Consulting Cardiologist: Dina Rich MD Primary Cardiologist: New Reason for Consultation: Syncope and Palpitations   Clinical Summary Mr. Brendan Martinez is a 25 y.o.male who was admitted through the ER with episode of syncope. He was found to have mild renal insufficiency with creatinine of 1.40.   He had one episode 3 years ago, after playing football in college. Seen in [place] where he had a stress test and an echo, [facility]) which he states was normal but was told he had an enlarged heart.  He states symptoms began again 3 months ago. He awoke from sleep feeling like he was "fadding out" and felt his HR elevated. He was seen in [place] and diagnosed with panic attacks, given antianxiety medication and released.   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 He began to feel very dizzy and lightheaded. Called for help and EMS was summoned. He states that he continued to feel his HR racing and was told that his BP was elevated. No EMS documentation is available to review.   On arrival to ER, BP 136/66, HR 96, afebrile. EKG NSR with LVH. Potassium of Na of 136, Potassium of 3.6, Creatinine of 1.34. WBC 3.7. CT of brain negative for acute abnormalities. CXR negative for acute cardiopulmonary abnormalities. He had an episode of unresponsiveness with no changes on telemetry, but responded to verbal stimuli and sternal rub.   Yesterday afternoon, he was in bed when the same symptoms occurred again, with review of telemetry demonstrating sinus tachycardia rate of 119-120 bpm.  He was seen by his nurse who helped him to slow down his breathing and relax. He was given a Xanax.    No Known Allergies  Medications Scheduled Medications: . enoxaparin (LOVENOX) injection  40 mg Subcutaneous Q24H  . sodium chloride flush  3 mL Intravenous Q12H      PRN Medications: acetaminophen **OR** acetaminophen, alum & mag hydroxide-simeth, ibuprofen, ondansetron **OR** ondansetron (ZOFRAN) IV   Past Medical History  Diagnosis Date  . Anxiety   . Heart palpitations   . OSA (obstructive sleep apnea)     History reviewed. No pertinent past surgical history.  Family History  Problem Relation Age of Onset  . Arrhythmia Sister   . Diabetes Father   . Hypertension Mother     Social History Mr. Brendan Martinez reports that he has quit smoking. He does not have any smokeless tobacco history on file. Mr. Brendan Martinez reports that he does not drink alcohol.  Review of Systems Complete review of systems are found to be negative unless outlined in H&P above.  Physical Examination Blood pressure 128/89, pulse 77, temperature 97.9 F (36.6 C), temperature source Oral, resp. rate 20, height  (1.854 m), weight 272 lb (123.378 kg), SpO2 99 %.  Intake/Output Summary (Last 24 hours) at 01/25/16 1032 Last data filed at 01/25/16 0700  Gross per 24 hour  Intake    120 ml  Output   2700 ml  Net  -2580 ml    Telemetry: NSR with two episodes of Sinus tachycardia with rates of 119 bpm-120 bpm 4 pm 01/24/2016.  GEN: No acute distress HEENT: Conjunctiva and lids normal, oropharynx clear with moist mucosa. Neck: Supple, no elevated JVP or carotid bruits, no thyromegaly. Lungs: Clear to auscultation, nonlabored breathing at rest. Cardiac: Regular rate and rhythm, no S3 or significant systolic murmur, no pericardial rub. Abdomen: Soft, nontender, no hepatomegaly, bowel sounds present, no guarding or rebound. Extremities: No pitting edema, distal pulses 2+. Skin: Warm and  dry. Musculoskeletal: No kyphosis. Neuropsychiatric: Alert and oriented x3, affect grossly appropriate.  Prior Cardiac Testing/Procedures 1.Echocardiogram [facility] 11/19/2012) Left Ventricle: The left ventricle was normal in size with mild concentric hypertrophy. Left Ventricle The left ventricular systolic function was normal. Function: Ejection fraction was 55%. Normal diastolic function. Tissue Doppler 13 cm/sec, E/E'=7. Left Atrium: The left atrium was normal in size. Estimated mean pressure 11 mmHg. Right Ventricle: The right ventricle was normal in size and function. TAPSE is 2.3 cm. Right Atrium: The right atrium was normal in size. Mitral Valve: The mitral leaflets were normal. Trace regurgitation. Aortic Valve: The aortic valve leaflets were normal. Tricuspid Valve: The tricuspid valve was structurally normal. Trace tricuspid regurgitation. Normal pulmonary artery pressure of 26 mmHg. Pulmonic Valve: The pulmonic valve was structurally normal. Mild pulmonic regurgitation. End diastolic pressure 11 mmHg. Aorta: Normal aortic root diameter. Pericardium: The pericardium was normal.  2. Stress Test [facility] 11/16/2012) EKG Analysis Resting EKG: Normal sinus rhythm  Exercise EKG:  No ST segment changes. Arrhythmias: None EKG Interpretation: Negative EKG response. Echocardiograpic Analysis Image Quality: adequate  Wall Segments Baseline Immediately Post Stress Basal Anteroseptal Normal Hyperdynamic Anterior Normal Hyperdynamic Anterolateral Normal Hyperdynamic Inferolateral Normal Hyperdynamic Inferior Normal Hyperdynamic Inferoseptal Normal Hyperdynamic Mid  Anteroseptal Normal Hyperdynamic Anterior Normal Hyperdynamic Anterolateral Normal Hyperdynamic Inferolateral Normal Hyperdynamic Inferior Normal Hyperdynamic Inferoseptal Normal Hyperdynamic Apical  Anterior Normal Hyperdynamic Lateral Normal Hyperdynamic Inferior Normal  Hyperdynamic Septal Normal Hyperdynamic  Cap Normal Hyperdynamic  LV Chamber Size Normal Smaller  Global Systolic Function Normal Hyperdynamic   1.Echocardiogram: 01/24/2016 Left ventricle: The cavity size was normal. Wall thickness was  increased in a pattern of moderate LVH. There was severe focal  basal hypertrophy of the septum. Systolic function was normal.  The estimated ejection fraction was in the range of 55% to 60%.  Wall motion was normal; there were no regional wall motion  abnormalities. Left ventricular diastolic function parameters  were normal. - Aortic valve: Structurally normal valve. Trileaflet. - Mitral valve: Mildly thickened leaflets . There was no  significant regurgitation. - Left atrium: The atrium was normal in size. - Right ventricle: The cavity size was mildly dilated. Systolic  function was normal. - Right atrium: The atrium was mildly dilated. - Tricuspid valve: There was mild regurgitation. - Pulmonary arteries: PA peak pressure: 21 mm Hg (S). - Inferior vena cava: The vessel was normal in size. The  respirophasic diameter changes were in the normal range (>= 50%),  consistent with normal central venous pressure.  Lab Results  Basic Metabolic Panel:  Recent Labs Lab 01/24/16 0003 01/24/16 0016 01/24/16 0403  NA 134* 137 134*  K 3.6 3.6 3.8  CL 98* 95* 100*  CO2 29  --  27  GLUCOSE 96 94 90  BUN 16 15 14   CREATININE 1.34* 1.40* 1.13  CALCIUM 8.4*  --  8.6*    Liver Function Tests:  Recent Labs Lab 01/24/16 0003  AST 39  ALT 30  ALKPHOS 65  BILITOT 1.1  PROT 7.7  ALBUMIN 3.9    CBC:  Recent  Labs Lab 01/24/16 0003 01/24/16 0016 01/24/16 0403  WBC 3.7*  --  3.5*  NEUTROABS 1.0*  --   --   HGB 14.4 16.0 14.6  HCT 42.9 47.0 43.3  MCV 87.4  --  86.8  PLT 195  --  201    Cardiac Enzymes:  Recent Labs Lab 01/24/16 0003 01/24/16 0411 01/24/16 0905 01/24/16 1506  TROPONINI <0.03 <0.03 <0.03 <0.03     Radiology: Ct Head Wo Contrast  01/24/2016  CLINICAL DATA:  Headache all day, weakness upon standing. EXAM: CT HEAD WITHOUT CONTRAST TECHNIQUE: Contiguous axial images were obtained from the base of the skull through the vertex without intravenous contrast. COMPARISON:  None. FINDINGS: There is no intracranial hemorrhage, mass or evidence of acute infarction. There is no extra-axial fluid collection. Gray matter and white matter appear normal. Cerebral volume is normal for age. Brainstem and posterior fossa are unremarkable. The CSF spaces appear normal. The bony structures are intact. There is mild membrane thickening in the ethmoid air cells. Remainder of the visible sinuses are clear. IMPRESSION: Normal brain Electronically Signed   By: Ellery Plunk M.D.   On: 01/24/2016 01:10   Dg Chest Portable 1 View  01/24/2016  CLINICAL DATA:  Acute onset of generalized weakness and altered level of consciousness. Patient unresponsive. Initial encounter. EXAM: PORTABLE CHEST 1 VIEW COMPARISON:  None. FINDINGS: The lungs are well-aerated and clear. There is no evidence of focal opacification, pleural effusion or pneumothorax. The cardiomediastinal silhouette is borderline normal in size. No acute osseous abnormalities are seen. IMPRESSION: No acute cardiopulmonary process seen. Electronically Signed   By: Roanna Raider M.D.   On: 01/24/2016 00:42     ECG: Sinus tachycardia rate of 106 bpm. No evidence of WPW.    Impression and Recommendations  1. Tachycardia: Two episodes of elevated heart rate 119 -120 yesterday afternoon at rest. Symptomatic with lightheadedness, chest pressure and dizziness. Relieved with Xanax and relaxation breathing. Echo demonstrates moderate LVH. There was severe focal basal hypertrophy of the septum with EF of 55%-60%.  TSH is normal. Currently NSR rates in the 70's, with no further episodes. Potassium is improved. Consider low dose metoprolol 12.5 mg BID for heart rate  control. Monitor as OP for ongoing evaluation.   2. Syncope: Associated with elevated HR. Has had two episodes over the last 3 months.   3. Hx of Panic Attacks: He has not been followed by psychiatry nor is he seeing a Veterinary surgeon. Consider this as OP if clinically necessary.    Signed: Bettey Mare. Lawrence NP [facility]  01/25/2016, 10:32 AM Co-Sign MD Patient seen and discussed with NP Lyman Bishop, I agree with her documentation. 25 yo male history of anxiety and heart palpitations admitted with syncope. Episode occurred at work followed by repeat episode in ER. Reports prior history of syncope in 2013 with negative stress test at that time.   ER Vitals: p 96 bp 136/66 97% RA Orthostatics negative K 3.6 Cr 1.34-->1.4-->1.13 Hgb 14.4 Plt 195  UDS neg  Trop x 4 neg TSH 2.1  CT head no acute process CXR no acute process EKG NSR, normal PR, normal QTc, LVH Echo 01/24/16: LVEF 55-60%, no WMAs, mildly dilated RV  11/2012 [facility] echo: LVEF 55%, mild concentric LVH 11/2012 [facility] stress echo: stage IV Bruce , 97% THR, negative for iscehmia. No arrhythmias. Outflow gradient at rest 4, increased to 19 with exercise.  2013 Holter results not available on Care everywhere  Approximaetly 4 episodes of  syncope since 2013. First episode in 2013 occurred during a break at football practice. Patient sat down to drink from water bottle and suddenly felt his heart racing, + chest pain, + lightheaded. Went to stand up and passed out. He is not sure how long he was out for. It was after that episode that he was worked up at [employer] without significant findings including echo, stress echo. There was some mentioning of RV wall thickening from that evaluation. Holter monitor was done but results not available at this time. Second episode Dec 2016. He awoke from sleep with sudden palpitations, felt lightheaded. Tried to get to his feet but loss consciouness. 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 Repeat episode once he got to ER, from nursing note telemetry at that time appeared normal. He does report intermittent episodes of palpitations as well, often in the cool down periods after exercise.He does have history of anxiety and is worried these may be panic attacks. No family history of heart disease or early cardiac death.   Unclear etiology of syncope. He does have focal basal septal hypetrtophy measuring 1.5 cm, with a fairly normal rest of his anteroseptal wall and no dynamic gradient. Odd finding given his young age and lack of HTN, concern would be for focal hypertrophic CM. He also looks to have some RV enlargement, the RV free wall is poorly visualized but from workup 2013 there was some concern about RVH. Given recurrent episodes of syncope in otherwise young patient will arrange outpatient cardiac MRI to look for possible focal hypertrophic CM or ARVD, we will also arrange to repeat an outpatient monitor x 30 days and have EP follow up. If no recurrent episodes and stable tele would be ok for discharge later today. Advised to limit workouts, would suggest 2 weeks work absence to allow further workup and monitoring for recurrence of symptoms, he works as prison guard and episodes can put him at risk.    Dominga Ferry MD

## 2016-01-25 NOTE — Progress Notes (Signed)
Patient had an episode of anxiety, increased breathing and HR up to 98. Pt was sweaty and crying. No PRN medications available. Essential oils given. MD notified and aware. MD ordered no PRN medication due to possibility of making pt too sedated. Ordered to monitor and make MD aware if episode happens again. RN will continue to monitor. Lesly Dukesachel J Everett, RN

## 2016-01-25 NOTE — Progress Notes (Signed)
[id number] Patient reported feeling like his heart was racing and noted anxious. Patient instructed to breathe in through his nose and out through his mouth slowly with control. Spoke with patient to distract him from becoming more anxious and showed him his telemetry monitor that showed his pulse at 83. Patient requested anxiety medication and was informed that he no longer had a current order for an anti-anxiety medication d/t patient appearing "comatose" yesterday morning. Patient reported that he didn't recall anyone trying to wake him up yesterday morning and he had been awake all night and after receiving his anti-anxiety medication that was the best he had slept and rested. 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

## 2016-01-25 NOTE — Progress Notes (Signed)
PAtient reports feeling dizzy, but not sure if he just isn't very tired.  I told him to rest his eyes and see if that helped and let me know if it didn't.  BP is 118/55.  MD notified.  Will carry out any orders received.  RN will continue to monitor patient and report any changes to MD.

## 2016-01-25 NOTE — Progress Notes (Signed)
Telemetry reviewed later in the day. No arrhythmias noted. We will arrange outpatient cardiac MRI, 21 day event monitor, and outpatient EP referral. No further cardiac testing planned. Will start lopressor 12.5mg  bid to help with symptoms. Ok for discharge from cardiac standpoint today.   Dominga FerryJ Channie Bostick MD

## 2016-01-25 NOTE — Progress Notes (Signed)
[facility] PROGRESS NOTE  Zannie Runkle YNW:[id number] DOB: 12/08/1990 DOA: 01/23/2016 PCP: No primary care provider on file.  Assessment/Plan: 25 y/o male with remote syncope  presented with episode of syncope.   1. Syncope of unclear etiology. Patient reports h/o syncope. This time: he developed syncope after having sudden headache, pressure, which he states lowered into his chest, described as squeezing, with associated dyspnea and racing HR -echo: LVEF 55-60%. Wall thickness was  increased in a pattern of moderate LVH. There was severe focal  basal hypertrophy of the septum. Systolic function was normal.  The estimated ejection fraction was in the range of 55% to 60%.  Wall motion was normal; there were no regional wall motion  abnormalities. Left ventricular diastolic function parameters  were normal. -TSH-2.1. Check 24 hrs urine cathecholamine, metanephrine,  aslo IGF1. Carotid US. Cardiology plans: outpatient cardiac MRI  2. Anxiety. Apparently, patient is very sensitive to benzo. Will try to avoid   3. AKI. resolving with IVF   Code Status: full Family Communication: d/w patient, his parents (indicate person spoken with, relationship, and if by phone, the number) Disposition Plan: home 24-48 hrs    Consultants:  Cardiology   Procedures:  echo  Antibiotics:  none (indicate start date, and stop date if known)  HPI/Subjective: anxiety  Objective: Filed Vitals:   01/24/16 2300 01/25/16 0630  BP: 128/89   Pulse: 77   Temp: 98.6 F (37 C) 97.9 F (36.6 C)  Resp: 16 20    Intake/Output Summary (Last 24 hours) at 01/25/16 1413 Last data filed at 01/25/16 0700  Gross per 24 hour  Intake      0 ml  Output   2200 ml  Net  -2200 ml   Filed Weights   01/23/16 2340 01/24/16 0322  Weight: 125.646 kg (277 lb) 123.378 kg (272 lb)    Exam:   General:  Alert, no distress   Cardiovascular: s1,s2 rrr  Respiratory: CTA BL   Abdomen: soft, nt,nd    Musculoskeletal: no leg edema    Data Reviewed: Basic Metabolic Panel:  Recent Labs Lab 01/24/16 0003 01/24/16 0016 01/24/16 0403  NA 134* 137 134*  K 3.6 3.6 3.8  CL 98* 95* 100*  CO2 29  --  27  GLUCOSE 96 94 90  BUN CREATININE 1.34* 1.40* 1.13  CALCIUM 8.4*  --  8.6*   Liver Function Tests:  Recent Labs Lab 01/24/16 0003  AST 39  ALT 30  ALKPHOS 65  BILITOT 1.1  PROT 7.7  ALBUMIN 3.9   No results for input(s): LIPASE, AMYLASE in the last 168 hours. No results for input(s): AMMONIA in the last 168 hours. CBC:  Recent Labs Lab 01/24/16 0003 01/24/16 0016 01/24/16 0403  WBC 3.7*  --  3.5*  NEUTROABS 1.0*  --   --   HGB 14.4 16.0 14.6  HCT 42.9 47.0 43.3  MCV 87.4  --  86.8  PLT 195  --  201   Cardiac Enzymes:  Recent Labs Lab 01/24/16 0003 01/24/16 0411 01/24/16 0905 01/24/16 1506  TROPONINI <0.03 <0.03 <0.03 <0.03   BNP (last 3 results) No results for input(s): BNP in the last 8760 hours.  ProBNP (last 3 results) No results for input(s): PROBNP in the last 8760 hours.  CBG:  Recent Labs Lab 01/24/16 0018  GLUCAP 89    No results found for this or any previous visit (from the past 240 hour(s)).   Studies: Ct Head  Wo Contrast  01/24/2016  CLINICAL DATA:  Headache all day, weakness upon standing. EXAM: CT HEAD WITHOUT CONTRAST TECHNIQUE: Contiguous axial images were obtained from the base of the skull through the vertex without intravenous contrast. COMPARISON:  None. FINDINGS: There is no intracranial hemorrhage, mass or evidence of acute infarction. There is no extra-axial fluid collection. Gray matter and white matter appear normal. Cerebral volume is normal for age. Brainstem and posterior fossa are unremarkable. The CSF spaces appear normal. The bony structures are intact. There is mild membrane thickening in the ethmoid air cells. Remainder of the visible sinuses are clear. IMPRESSION: Normal brain Electronically Signed    By: Ellery Plunkaniel R Mitchell M.D.   On: 01/24/2016 01:10   Dg Chest Portable 1 View  01/24/2016  CLINICAL DATA:  Acute onset of generalized weakness and altered level of consciousness. Patient unresponsive. Initial encounter. EXAM: PORTABLE CHEST 1 VIEW COMPARISON:  None. FINDINGS: The lungs are well-aerated and clear. There is no evidence of focal opacification, pleural effusion or pneumothorax. The cardiomediastinal silhouette is borderline normal in size. No acute osseous abnormalities are seen. IMPRESSION: No acute cardiopulmonary process seen. Electronically Signed   By: Roanna RaiderJeffery  Chang M.D.   On: 01/24/2016 00:42    Scheduled Meds: . enoxaparin (LOVENOX) injection  40 mg Subcutaneous Q24H  . sodium chloride flush  3 mL Intravenous Q12H   Continuous Infusions:   Principal Problem:   Syncope Active Problems:   Palpitations   Anxiety   Chest pain   Renal insufficiency   AKI (acute kidney injury) (HCC)    Time spent: >35 minutes     Esperanza SheetsBURIEV, Breda Bond N  [facility] Pager [phone]. If 7PM-7AM, please contact night-coverage at [email or web address], password [email or web address] 01/25/2016, 2:13 PM

## 2016-01-26 ENCOUNTER — Telehealth: Payer: Self-pay | Admitting: *Deleted

## 2016-01-26 ENCOUNTER — Other Ambulatory Visit: Payer: Self-pay | Admitting: *Deleted

## 2016-01-26 ENCOUNTER — Observation Stay (INDEPENDENT_AMBULATORY_CARE_PROVIDER_SITE_OTHER): Payer: BLUE CROSS/BLUE SHIELD

## 2016-01-26 DIAGNOSIS — F419 Anxiety disorder, unspecified: Secondary | ICD-10-CM | POA: Diagnosis not present

## 2016-01-26 DIAGNOSIS — R55 Syncope and collapse: Secondary | ICD-10-CM

## 2016-01-26 DIAGNOSIS — I422 Other hypertrophic cardiomyopathy: Secondary | ICD-10-CM

## 2016-01-26 DIAGNOSIS — R002 Palpitations: Secondary | ICD-10-CM | POA: Diagnosis not present

## 2016-01-26 DIAGNOSIS — N179 Acute kidney failure, unspecified: Secondary | ICD-10-CM | POA: Diagnosis not present

## 2016-01-26 LAB — INSULIN-LIKE GROWTH FACTOR: Somatomedin C: 264 ng/mL (ref 115–355)

## 2016-01-26 LAB — HEMOGLOBIN A1C
Hgb A1c MFr Bld: 5 % (ref 4.8–5.6)
Mean Plasma Glucose: 97 mg/dL

## 2016-01-26 MED ORDER — VENLAFAXINE HCL 75 MG PO TABS: 75.0000 mg | ORAL_TABLET | Freq: Two times a day (BID) | ORAL | Status: DC

## 2016-01-26 MED ORDER — METOPROLOL TARTRATE 25 MG PO TABS: 12.5000 mg | ORAL_TABLET | Freq: Two times a day (BID) | ORAL | Status: DC

## 2016-01-26 NOTE — Telephone Encounter (Signed)
Will forward to [place]  

## 2016-01-26 NOTE — Discharge Summary (Signed)
Physician Discharge Summary  Brendan Martinez WUJ:[id number] DOB: 1991/01/17 DOA: 01/23/2016  PCP: No primary care provider on file.  Admit date: 01/23/2016 Discharge date: 01/26/2016  Time spent: >35 minutes  Recommendations for Outpatient Follow-up:  PCP in 3-7 days Cardiology with event monitor, and cardiac MRI  Discharge Diagnoses:  Principal Problem:   Syncope Active Problems:   Palpitations   Anxiety   Chest pain   Renal insufficiency   AKI (acute kidney injury) [facility])   Discharge Condition: stable   Diet recommendation: regular   Filed Weights   01/23/16 2340 01/24/16 0322  Weight: 125.646 kg (277 lb) 123.378 kg (272 lb)    History of present illness:  25 y/o male with remote syncope presented with episode of syncope.  -"He reports feeling having a headache and palpitations then beginning to have chest pain and then blacked out. He suffered another episode in the ED that was witnessed by staff, and no arrhythmias were captured on the monitor at that time. The EDP reported that he was passed out and unresponsive for a few minutes. He was not confused afterward. His initial workup in the ED was negative.   Of Note: Patient reports having 2 previous episodes of syncope, the first in college in 2013 and he had a Stress test at that time and was told he had a thickened right Ventricle, and he reports that he was told to stop lifting weights. He denies nay medical problems but he does take Paxil for Anxiety/Panic Attacks. "   Hospital Course:  Syncope of unclear etiology. Patient reported h/o syncope. This time: he developed syncope after having sudden headache, pressure, which he states lowered into his chest, described as squeezing, with associated dyspnea and racing HR -echo: LVEF 55-60%. Wall thickness was  increased in a pattern of moderate LVH. There was severe focal  basal hypertrophy of the septum. Systolic function was normal.  The estimated ejection  fraction was in the range of 55% to 60%.  Wall motion was normal; there were no regional wall motion  abnormalities. Left ventricular diastolic function parameters  were normal. -He is medically stable for discharge. tachycardia>resolved, he is started on metoprolol per cards. TSH-2.1. Carotid US: unremarkable. Cardiology plans: outpatient cardiac MRI, event monitor. Pend 24 hrs urine cathecholamine, metanephrine, aslo IGF1. Recommended to f/u test results in 1 week with PCP  Anxiety. Apparently, patient is very sensitive to benzo. Will try to avoid benzo. Start Effexor   AKI. Resolved with IVF   Procedures: Echo:Study Conclusions  - Left ventricle: The cavity size was normal. Wall thickness was  increased in a pattern of moderate LVH. There was severe focal  basal hypertrophy of the septum. Systolic function was normal.  The estimated ejection fraction was in the range of 55% to 60%.  Wall motion was normal; there were no regional wall motion  abnormalities. Left ventricular diastolic function parameters  were normal. - Aortic valve: Structurally normal valve. Trileaflet. - Mitral valve: Mildly thickened leaflets . There was no  significant regurgitation. - Left atrium: The atrium was normal in size. - Right ventricle: The cavity size was mildly dilated. Systolic  function was normal. - Right atrium: The atrium was mildly dilated. - Tricuspid valve: There was mild regurgitation. - Pulmonary arteries: PA peak pressure: 21 mm Hg (S). - Inferior vena cava: The vessel was normal in size. The  respirophasic diameter changes were in the normal range (>= 50%),  consistent with normal central venous pressure.    (i.e.  Studies not automatically included, echos, thoracentesis, etc; not x-rays)  Consultations:  Cardiology   Discharge Exam: Filed Vitals:   01/26/16 0711 01/26/16 1037  BP: 124/64 120/63  Pulse: 67 60  Temp: 97.5 F (36.4 C)   Resp: 20      General: alert. No distress  Cardiovascular: s1,s2 rrr Respiratory: CTA BL  Discharge Instructions  Discharge Instructions    Diet - low sodium heart healthy    Complete by:  As directed      Discharge instructions    Complete by:  As directed   Please follow up with cardiology as outpatient. Please follow up with primary care doctor in 1 week     Increase activity slowly    Complete by:  As directed             Medication List    TAKE these medications        FISH OIL PO  Take 1 capsule by mouth daily.     MAGNESIA PO  Take 1 tablet by mouth daily.     metoprolol tartrate 25 MG tablet  Commonly known as:  LOPRESSOR  Take 0.5 tablets (12.5 mg total) by mouth 2 (two) times daily.     venlafaxine 75 MG tablet  Commonly known as:  EFFEXOR  Take 1 tablet (75 mg total) by mouth 2 (two) times daily.       No Known Allergies     Follow-up Information    Follow up with Joni Reining, NP On 03/01/2016.   Specialties:  Nurse Practitioner, Radiology, Cardiology   Why:  1:30 pm   Contact information:   [address] [place] [address] [phone]        The results of significant diagnostics from this hospitalization (including imaging, microbiology, ancillary and laboratory) are listed below for reference.    Significant Diagnostic Studies: Ct Head Wo Contrast  01/24/2016  CLINICAL DATA:  Headache all day, weakness upon standing. EXAM: CT HEAD WITHOUT CONTRAST TECHNIQUE: Contiguous axial images were obtained from the base of the skull through the vertex without intravenous contrast. COMPARISON:  None. FINDINGS: There is no intracranial hemorrhage, mass or evidence of acute infarction. There is no extra-axial fluid collection. Gray matter and white matter appear normal. Cerebral volume is normal for age. Brainstem and posterior fossa are unremarkable. The CSF spaces appear normal. The bony structures are intact. There is mild membrane thickening in the ethmoid  air cells. Remainder of the visible sinuses are clear. IMPRESSION: Normal brain Electronically Signed   By: Ellery Plunk M.D.   On: 01/24/2016 01:10   US Carotid Bilateral  01/25/2016  CLINICAL DATA:  Headache and dizziness for 1 day. History of smoking. EXAM: BILATERAL CAROTID DUPLEX ULTRASOUND TECHNIQUE: Wallace Cullens scale imaging, color Doppler and duplex ultrasound were performed of bilateral carotid and vertebral arteries in the neck. COMPARISON:  None. FINDINGS: Criteria: Quantification of carotid stenosis is based on velocity parameters that correlate the residual internal carotid diameter with NASCET-based stenosis levels, using the diameter of the distal internal carotid lumen as the denominator for stenosis measurement. The following velocity measurements were obtained: RIGHT ICA:  67/15 cm/sec CCA:  145/26 cm/sec SYSTOLIC ICA/CCA RATIO:  0.5 DIASTOLIC ICA/CCA RATIO:  0.6 ECA:  62 cm/sec LEFT ICA:  54/17 cm/sec CCA:  128/29 cm/sec SYSTOLIC ICA/CCA RATIO:  0.4 DIASTOLIC ICA/CCA RATIO:  0.6 ECA:  50 cm/sec RIGHT CAROTID ARTERY: There is no grayscale evidence of significant intimal thickening or atherosclerotic plaque affecting interrogated  portions of the right carotid system. There are no elevated peak systolic velocities within the interrogated course of the right internal carotid artery to suggest a hemodynamically significant stenosis. RIGHT VERTEBRAL ARTERY:  Antegrade flow LEFT CAROTID ARTERY: There is no grayscale evidence of significant intimal thickening or atherosclerotic plaque affecting the interrogated portions of the left carotid system. There are no elevated peak systolic velocities within the interrogated course the left internal carotid artery to suggest a hemodynamically significant stenosis. LEFT VERTEBRAL ARTERY:  Antegrade flow IMPRESSION: Normal carotid Doppler ultrasound. Electronically Signed   By: Simonne ComeJohn  Watts M.D.   On: 01/25/2016 15:29   Dg Chest Portable 1 View  01/24/2016   CLINICAL DATA:  Acute onset of generalized weakness and altered level of consciousness. Patient unresponsive. Initial encounter. EXAM: PORTABLE CHEST 1 VIEW COMPARISON:  None. FINDINGS: The lungs are well-aerated and clear. There is no evidence of focal opacification, pleural effusion or pneumothorax. The cardiomediastinal silhouette is borderline normal in size. No acute osseous abnormalities are seen. IMPRESSION: No acute cardiopulmonary process seen. Electronically Signed   By: Roanna RaiderJeffery  Chang M.D.   On: 01/24/2016 00:42    Microbiology: No results found for this or any previous visit (from the past 240 hour(s)).   Labs: Basic Metabolic Panel:  Recent Labs Lab 01/24/16 0003 01/24/16 0016 01/24/16 0403  NA 134* 137 134*  K 3.6 3.6 3.8  CL 98* 95* 100*  CO2 29  --  27  GLUCOSE 96 94 90  BUN 16 15 14   CREATININE 1.34* 1.40* 1.13  CALCIUM 8.4*  --  8.6*   Liver Function Tests:  Recent Labs Lab 01/24/16 0003  AST 39  ALT 30  ALKPHOS 65  BILITOT 1.1  PROT 7.7  ALBUMIN 3.9   No results for input(s): LIPASE, AMYLASE in the last 168 hours. No results for input(s): AMMONIA in the last 168 hours. CBC:  Recent Labs Lab 01/24/16 0003 01/24/16 0016 01/24/16 0403  WBC 3.7*  --  3.5*  NEUTROABS 1.0*  --   --   HGB 14.4 16.0 14.6  HCT 42.9 47.0 43.3  MCV 87.4  --  86.8  PLT 195  --  201   Cardiac Enzymes:  Recent Labs Lab 01/24/16 0003 01/24/16 0411 01/24/16 0905 01/24/16 1506  TROPONINI <0.03 <0.03 <0.03 <0.03   BNP: BNP (last 3 results) No results for input(s): BNP in the last 8760 hours.  ProBNP (last 3 results) No results for input(s): PROBNP in the last 8760 hours.  CBG:  Recent Labs Lab 01/24/16 0018  GLUCAP 89       Signed:  Rafia Shedden N  [facility] 01/26/2016, 11:34 AM

## 2016-01-26 NOTE — Progress Notes (Signed)
[facility] PROGRESS NOTE  Brendan Martinez ZOX:[id number] DOB: 1991-03-10 DOA: 01/23/2016 PCP: No primary care provider on file.  Assessment/Plan: 25 y/o male with remote syncope  presented with episode of syncope.   1. Syncope of unclear etiology. Patient reports h/o syncope. This time: he developed syncope after having sudden headache, pressure, which he states lowered into his chest, described as squeezing, with associated dyspnea and racing HR -echo: LVEF 55-60%. Wall thickness was  increased in a pattern of moderate LVH. There was severe focal  basal hypertrophy of the septum. Systolic function was normal.  The estimated ejection fraction was in the range of 55% to 60%.  Wall motion was normal; there were no regional wall motion  abnormalities. Left ventricular diastolic function parameters  were normal. -TSH-2.1. Carotid US: unremarkable. Cardiology plans: outpatient cardiac MRI, event monitor. Pend 24 hrs urine cathecholamine, metanephrine,  aslo IGF1. Recommended to f/u test results in 1 week with PCP  2. Anxiety. Apparently, patient is very sensitive to benzo. Will try to avoid benzo. Start Effexor    3. AKI. Resolved  with IVF  Discharge home today   Code Status: full Family Communication: d/w patient, his parents (indicate person spoken with, relationship, and if by phone, the number) Disposition Plan: home 24-48 hrs    Consultants:  Cardiology   Procedures:  echo  Antibiotics:  none (indicate start date, and stop date if known)  HPI/Subjective: anxiety  Objective: Filed Vitals:   01/26/16 0711 01/26/16 1037  BP: 124/64 120/63  Pulse: 67 60  Temp: 97.5 F (36.4 C)   Resp: 20     Intake/Output Summary (Last 24 hours) at 01/26/16 1130 Last data filed at 01/26/16 1047  Gross per 24 hour  Intake    720 ml  Output   1600 ml  Net   -880 ml   Filed Weights   01/23/16 2340 01/24/16 0322  Weight: 125.646 kg (277 lb) 123.378 kg (272 lb)     Exam:   General:  Alert, no distress   Cardiovascular: s1,s2 rrr  Respiratory: CTA BL   Abdomen: soft, nt,nd   Musculoskeletal: no leg edema    Data Reviewed: Basic Metabolic Panel:  Recent Labs Lab 01/24/16 0003 01/24/16 0016 01/24/16 0403  NA 134* 137 134*  K 3.6 3.6 3.8  CL 98* 95* 100*  CO2 29  --  27  GLUCOSE 96 94 90  BUN 16 15 14   CREATININE 1.34* 1.40* 1.13  CALCIUM 8.4*  --  8.6*   Liver Function Tests:  Recent Labs Lab 01/24/16 0003  AST 39  ALT 30  ALKPHOS 65  BILITOT 1.1  PROT 7.7  ALBUMIN 3.9   No results for input(s): LIPASE, AMYLASE in the last 168 hours. No results for input(s): AMMONIA in the last 168 hours. CBC:  Recent Labs Lab 01/24/16 0003 01/24/16 0016 01/24/16 0403  WBC 3.7*  --  3.5*  NEUTROABS 1.0*  --   --   HGB 14.4 16.0 14.6  HCT 42.9 47.0 43.3  MCV 87.4  --  86.8  PLT 195  --  201   Cardiac Enzymes:  Recent Labs Lab 01/24/16 0003 01/24/16 0411 01/24/16 0905 01/24/16 1506  TROPONINI <0.03 <0.03 <0.03 <0.03   BNP (last 3 results) No results for input(s): BNP in the last 8760 hours.  ProBNP (last 3 results) No results for input(s): PROBNP in the last 8760 hours.  CBG:  Recent Labs Lab 01/24/16 0018  GLUCAP 89    No  results found for this or any previous visit (from the past 240 hour(s)).   Studies: US Carotid Bilateral  01/25/2016  CLINICAL DATA:  Headache and dizziness for 1 day. History of smoking. EXAM: BILATERAL CAROTID DUPLEX ULTRASOUND TECHNIQUE: Wallace Cullens scale imaging, color Doppler and duplex ultrasound were performed of bilateral carotid and vertebral arteries in the neck. COMPARISON:  None. FINDINGS: Criteria: Quantification of carotid stenosis is based on velocity parameters that correlate the residual internal carotid diameter with NASCET-based stenosis levels, using the diameter of the distal internal carotid lumen as the denominator for stenosis measurement. The following velocity  measurements were obtained: RIGHT ICA:  67/15 cm/sec CCA:  145/26 cm/sec SYSTOLIC ICA/CCA RATIO:  0.5 DIASTOLIC ICA/CCA RATIO:  0.6 ECA:  62 cm/sec LEFT ICA:  54/17 cm/sec CCA:  128/29 cm/sec SYSTOLIC ICA/CCA RATIO:  0.4 DIASTOLIC ICA/CCA RATIO:  0.6 ECA:  50 cm/sec RIGHT CAROTID ARTERY: There is no grayscale evidence of significant intimal thickening or atherosclerotic plaque affecting interrogated portions of the right carotid system. There are no elevated peak systolic velocities within the interrogated course of the right internal carotid artery to suggest a hemodynamically significant stenosis. RIGHT VERTEBRAL ARTERY:  Antegrade flow LEFT CAROTID ARTERY: There is no grayscale evidence of significant intimal thickening or atherosclerotic plaque affecting the interrogated portions of the left carotid system. There are no elevated peak systolic velocities within the interrogated course the left internal carotid artery to suggest a hemodynamically significant stenosis. LEFT VERTEBRAL ARTERY:  Antegrade flow IMPRESSION: Normal carotid Doppler ultrasound. Electronically Signed   By: Simonne Come M.D.   On: 01/25/2016 15:29    Scheduled Meds: . enoxaparin (LOVENOX) injection  40 mg Subcutaneous Q24H  . metoprolol tartrate  12.5 mg Oral BID  . sodium chloride flush  3 mL Intravenous Q12H   Continuous Infusions: . 0.9 % NaCl with KCl 20 mEq / L 100 mL/hr at 01/25/16 1430    Principal Problem:   Syncope Active Problems:   Palpitations   Anxiety   Chest pain   Renal insufficiency   AKI (acute kidney injury) (HCC)    Time spent: >35 minutes     Esperanza Sheets  [facility] Pager [phone]. If 7PM-7AM, please contact night-coverage at [email or web address], password [facility] 01/26/2016, 11:30 AM

## 2016-01-26 NOTE — Care Management Note (Signed)
Case Management Note  Patient Details  Name: Brendan Martinez MRN: [id number] Date of Birth: [birth year]  Subjective/Objective:                  Pt is from home, lives with family and is ind with ADL's. Pt has insurance, pt has no PCP but has filled out eligibility paperwork for multiple offices. Pt lives in [place]. Pt discharging home today with self care. Pt given list of PCP's in [place] accepting new pt's at pt's request. Pt does not wish to have any further assistance finding PCP care.   Action/Plan: No further CM needs.   Expected Discharge Date:     01/26/2016             Expected Discharge Plan:  Home/Self Care  In-House Referral:  NA  Discharge planning Services  CM Consult  Post Acute Care Choice:  NA Choice offered to:  NA  DME Arranged:    DME Agency:     HH Arranged:    HH Agency:     Status of Service:  Completed, signed off  Medicare Important Message Given:    Date Medicare IM Given:    Medicare IM give by:    Date Additional Medicare IM Given:    Additional Medicare Important Message give by:     If discussed at Long Length of Stay Meetings, dates discussed:    Additional Comments:  Malcolm MetroChildress, Oniel Meleski Demske, RN 01/26/2016, 12:22 PM

## 2016-01-26 NOTE — Telephone Encounter (Signed)
-----   Message from Antoine PocheJonathan F Branch, MD sent at 01/25/2016  3:07 PM EDT ----- This patient needs an outpatient cardiac MRI for hypertrophy, he also needs a 21 day event monitor for syncope and EP referral to Dr Johney FrameAllred for syncope.   Dominga FerryJ Branch MD

## 2016-01-26 NOTE — Progress Notes (Signed)
Discharge instructions reviewed with patient and father. Understanding verbalized. Awaiting 24 hour urine to be up then patient will be ready for discharge.

## 2016-01-27 NOTE — Addendum Note (Signed)
Addended by: Kerney ElbePINNIX, Amairany Schumpert G on: 01/27/2016 12:04 PM   Modules accepted: Orders

## 2016-01-28 ENCOUNTER — Telehealth: Payer: Self-pay | Admitting: Cardiology

## 2016-01-28 NOTE — Telephone Encounter (Signed)
Patient's father is returning call / tg

## 2016-01-29 ENCOUNTER — Encounter: Payer: Self-pay | Admitting: Cardiology

## 2016-01-29 LAB — METANEPHRINES, URINE, 24 HOUR
METANEPH TOTAL UR: 56 ug/L
Metanephrines, 24H Ur: 188 ug/24 hr (ref 45–290)
NORMETANEPHRINE 24H UR: 275 ug/(24.h) (ref 82–500)
NORMETANEPHRINE UR: 82 ug/L
Total Volume: 3350

## 2016-01-29 NOTE — Telephone Encounter (Signed)
Called patient and spoke with the father who I gave the appointment date, time and location of cardiac MRI on 02-09-16 at 3 p.m[facility].  Mailed letter and calendar to the patients address.

## 2016-01-31 LAB — 5 HIAA, QUANTITATIVE, URINE, 24 HOUR
5-HIAA, Ur: 1.1 mg/L
5-HIAA,Quant.,24 Hr Urine: 3.7 mg/24 hr (ref 0.0–14.9)
TOTAL VOLUME: 3350

## 2016-02-09 ENCOUNTER — Ambulatory Visit (HOSPITAL_COMMUNITY)
Admission: RE | Admit: 2016-02-09 | Discharge: 2016-02-09 | Disposition: A | Discharge status: Home / Self Care | Payer: BLUE CROSS/BLUE SHIELD | Source: Ambulatory Visit | Attending: Cardiology | Admitting: Cardiology

## 2016-02-09 DIAGNOSIS — R55 Syncope and collapse: Secondary | ICD-10-CM | POA: Insufficient documentation

## 2016-02-09 DIAGNOSIS — I422 Other hypertrophic cardiomyopathy: Secondary | ICD-10-CM | POA: Insufficient documentation

## 2016-02-09 MED ORDER — GADOBENATE DIMEGLUMINE 529 MG/ML IV SOLN
36.0000 mL | Freq: Once | INTRAVENOUS | Status: AC | PRN
  Administered 2016-02-09: 36 mL via INTRAVENOUS

## 2016-02-12 ENCOUNTER — Telehealth: Payer: Self-pay | Admitting: Cardiology

## 2016-02-12 NOTE — Telephone Encounter (Signed)
Needs a note to be sent to his work in regards to his ability to return. They need a note in reference to his testing results Please call patient  # [phone].

## 2016-02-12 NOTE — Telephone Encounter (Signed)
Will forward to Dr. Branch. 

## 2016-02-18 NOTE — Telephone Encounter (Signed)
02/15/16 - letter made by Misty StanleyLisa CMA [place] for pt work note

## 2016-02-24 ENCOUNTER — Ambulatory Visit: Payer: BLUE CROSS/BLUE SHIELD | Admitting: Internal Medicine

## 2016-03-01 ENCOUNTER — Encounter: Payer: BLUE CROSS/BLUE SHIELD | Admitting: Adult Health

## 2016-03-03 ENCOUNTER — Ambulatory Visit (INDEPENDENT_AMBULATORY_CARE_PROVIDER_SITE_OTHER): Payer: BLUE CROSS/BLUE SHIELD | Admitting: Physician Assistant

## 2016-03-03 ENCOUNTER — Encounter: Payer: Self-pay | Admitting: Physician Assistant

## 2016-03-03 VITALS — BP 134/88 | HR 103 | Ht 72.0 in | Wt 274.0 lb

## 2016-03-03 DIAGNOSIS — R002 Palpitations: Secondary | ICD-10-CM | POA: Diagnosis not present

## 2016-03-03 DIAGNOSIS — R55 Syncope and collapse: Secondary | ICD-10-CM | POA: Diagnosis not present

## 2016-03-03 NOTE — Patient Instructions (Signed)
Your physician recommends that you schedule a follow-up appointment As Needed.   Your physician recommends that you continue on your current medications as directed. Please refer to the Current Medication list given to you today.  Thank you for choosing [facility]!    

## 2016-03-03 NOTE — Progress Notes (Signed)
Cardiology Office Note   Date:  03/03/2016   ID:  Brendan Martinez, DOB 1991/09/03, MRN [id number]  PCP:  PROVIDER NOT IN SYSTEM  Cardiologist:  Dr Wynetta EmeryBranch  Barrett, Bjorn Loserhonda, PA-C   History of Present Illness: Brendan JabsRoy Dalton Martinez is a 25 y.o. male with a history of syncope 2014 in college s/p echo and stress test (?enlarged heart), palpitations.  Seen in hospital 03/20 after admission for chest pain, SOB, racing pulse, presyncope. Episodes in hospital associated with sinus tach. Echo w/ mod LVH.  Brendan Martinez presents for followup.  Mr. 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 He is still having panic attacks at times. He takes hydroxyzine when necessary for these. He does not understand why he started having these all of a sudden. He denies any traumatic event. 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   he does not excessively his caffeine.he has a heart rate track her but has not seen any heart rates greater than 150 beats per minute. He admits that he was obsessed with running at one time, and he had a panic attack after running. He was told he is over-exercising.  He barred a CPAP machine and is getting much better rest on it. He feels great when he wakes up in the morning. He had one but the machine is broken and his family physician is trying to get this on changed to his settings. He is encouraged to follow up on this.  He has not had any chest pain. He has been exercising regularly without difficulty. He used to weigh 310 pounds, and has lost some weight gradually over time.   Past Medical History  Diagnosis Date  . Anxiety   . Heart palpitations 01/2016    Sinus tach on an event monitor  . OSA (obstructive sleep apnea)     History reviewed. No pertinent past surgical history.  Current Outpatient Prescriptions  Medication Sig Dispense Refill  . hydrOXYzine  (VISTARIL) 25 MG capsule Take 25 mg by mouth as needed.    . Magnesium Hydroxide (MAGNESIA PO) Take 1 tablet by mouth daily.    . Omega-3 Fatty Acids (FISH OIL PO) Take 1 capsule by mouth daily.     No current facility-administered medications for this visit.    Allergies:   Review of patient's allergies indicates no known allergies.    Social History:  The patient  reports that he has quit smoking. He does not have any smokeless tobacco history on file. He reports that he does not drink alcohol or use illicit drugs.   Family History:  The patient's family history includes Arrhythmia in his sister; Diabetes in his father; Hypertension in his mother.    ROS:  Please see the history of present illness. All other systems are reviewed and negative.    PHYSICAL EXAM: VS:  BP 134/88 mmHg  Pulse 103  Ht 6' (1.829 m)  Wt 274 lb (124.286 kg)  BMI 37.15 kg/m2  SpO2 95% , BMI Body mass index is 37.15 kg/(m^2). GEN: Well nourished, well developed, male in no acute distress HEENT: normal for age  Neck: no JVD, no carotid bruit, no masses Cardiac: RRR; no murmur, no rubs, or gallops Respiratory:  clear to auscultation bilaterally, normal work of breathing GI: soft, nontender, nondistended, + BS MS: no deformity or atrophy; no  edema; distal pulses are 2+ in all 4 extremities  Skin: warm and dry, no rash Neuro:  Strength and sensation are intact Psych: euthymic mood, full affect   EKG:  EKG is not ordered today.  Cardiac monitor:  Telemetry strips show sinus rhythm and sinus tachycardia  Multiple symptoms reported, all correspond to normal sinus rhythm. Episode 02/10/16 at 1247AM marked as "passed out" shows normal sinus rhythm.  No significant arrhythmias  Cardiac MRI: 04/04 IMPRESSION: 1) Normal LV size and function EF 59% 2) No delayed gadolinium enhancement on inversion recovery sequences 3) Normal RV with no evidence of RV dysplasia 4) Normal coronary artery origins 5) No  pericardial effusion 6) Normal aortic root 7) Normal AV, MV and TV  Recent Labs: 01/24/2016: ALT 30; BUN 14; Creatinine, Ser 1.13; Hemoglobin 14.6; Platelets 201; Potassium 3.8; Sodium 134*; TSH 2.101    Lipid Panel No results found for: CHOL, TRIG, HDL, CHOLHDL, VLDL, LDLCALC, LDLDIRECT   Wt Readings from Last 3 Encounters:  03/03/16 274 lb (124.286 kg)  01/24/16 272 lb (123.378 kg)     Other studies Reviewed: Additional studies/ records that were reviewed today include: previous office notes and testing.  ASSESSMENT AND PLAN:  1.  Palpitations: The patient was reassured that we found no abnormalities in his heart muscle structure, heart muscle function, or heart circulation. The results of his monitor was reviewed with him and he was advised that there was no cardiac cause for his symptoms. He is encouraged to followup with primary care.  2. Anxiety: He is concerned because he does not understand why he started having this anxiety all of a sudden. I encouraged him to follow up with primary care for this and advised him that it might benefit him to take something for it.   Current medicines are reviewed at length with the patient today.  The patient does not have concerns regarding medicines.  The following changes have been made:  no change  Labs/ tests ordered today include:  No orders of the defined types were placed in this encounter.     Disposition:   FU with Dr. Wyline Mood as needed  Signed, Leanna Battles  03/03/2016 4:34 PM    [facility] Phone: [phone]; Fax: [phone]  This note was written with the assistance of speech recognition software. Please excuse any transcriptional errors.

## 2018-02-22 IMAGING — CR DG CHEST 1V PORT
1 series · 1 of 1 positions shown · non-contrast
Comparison: None.

CLINICAL DATA: Acute onset of generalized weakness and altered
level of consciousness. Patient unresponsive. Initial encounter.

EXAM:
PORTABLE CHEST 1 VIEW

[ap portable]
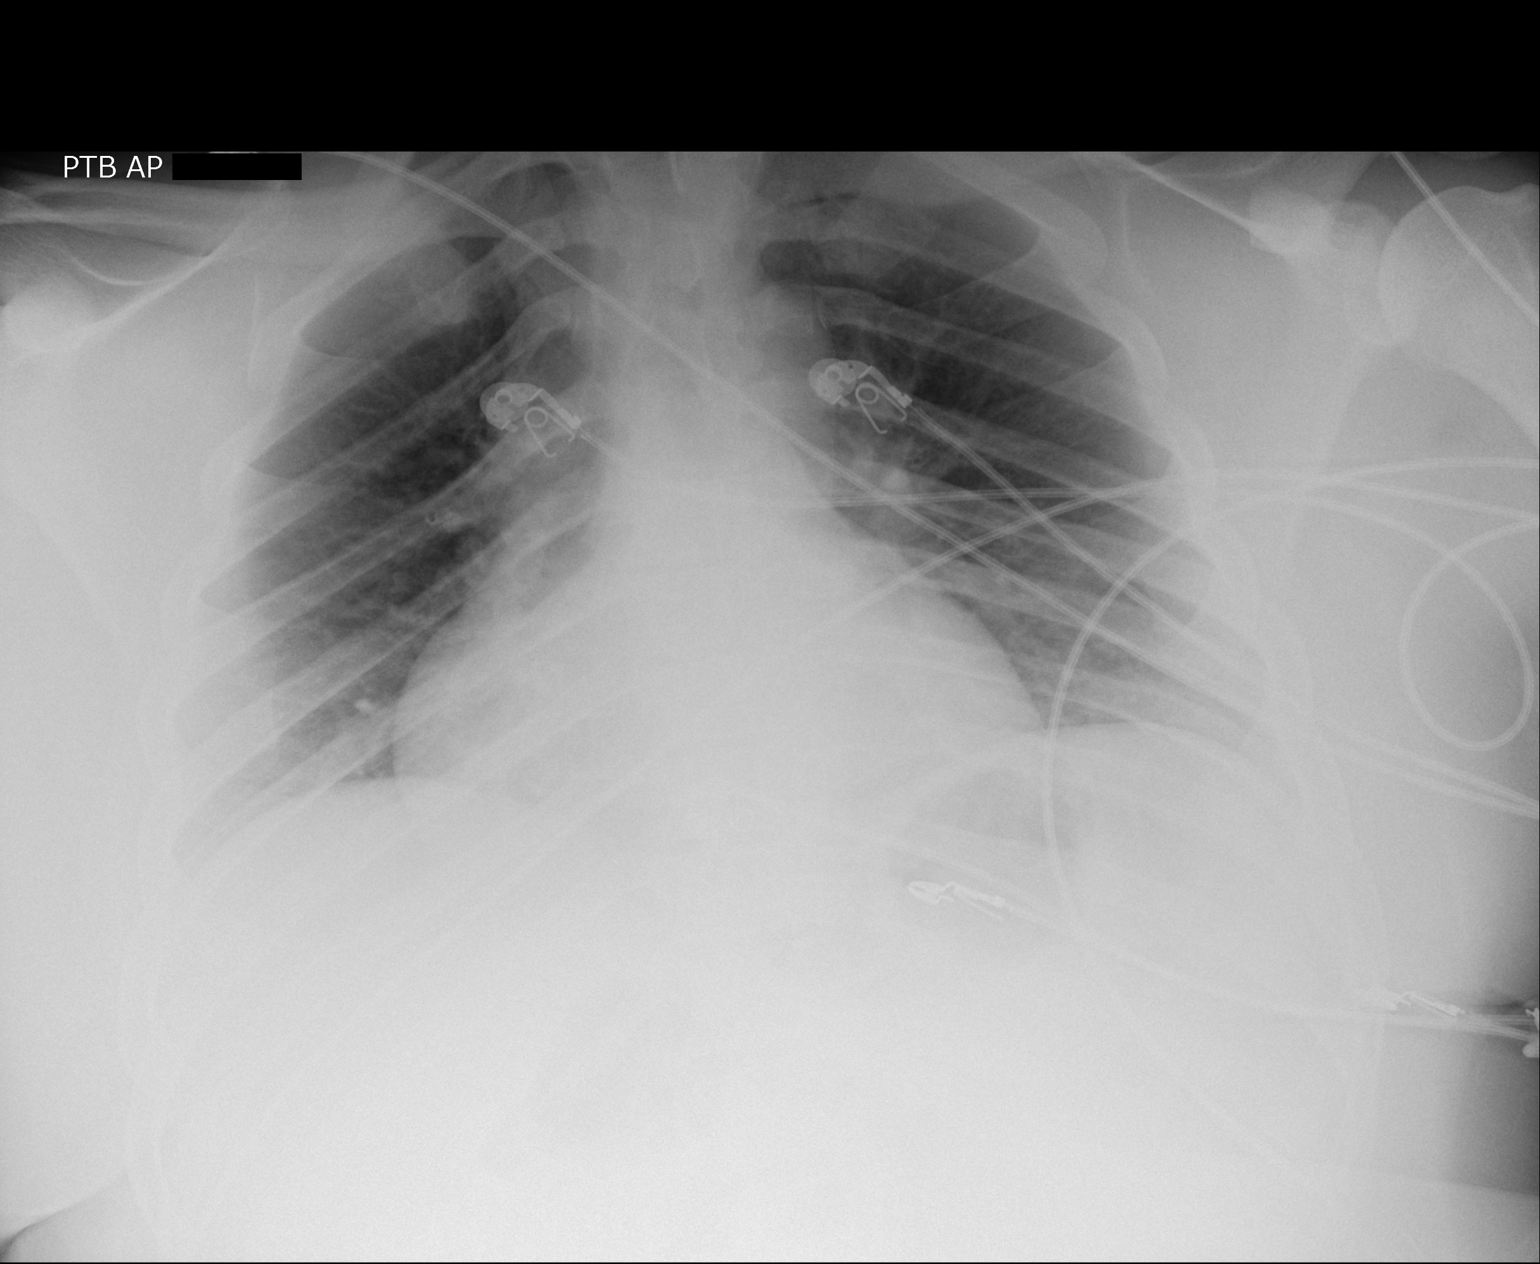

[1 of 1 positions shown; findings below may reference images not displayed]

FINDINGS: The lungs are well-aerated and clear. There is no evidence of focal
opacification, pleural effusion or pneumothorax.

The cardiomediastinal silhouette is borderline normal in size. No
acute osseous abnormalities are seen.
IMPRESSION: No acute cardiopulmonary process seen.

## 2018-02-22 IMAGING — CT CT HEAD W/O CM
1 series · 16 of 30 positions shown, 20 images · non-contrast
Comparison: None.

CLINICAL DATA: Headache all day, weakness upon standing.

EXAM:
CT HEAD WITHOUT CONTRAST
TECHNIQUE: Contiguous axial images were obtained from the base of the skull
through the vertex without intravenous contrast.

[Series 2: headtrauma 4.8 h37s · axial · 0.53mm/px · z∈[+65,+226]mm · 16 of 36 slices shown, 20 images]
[im 2/36  brain]
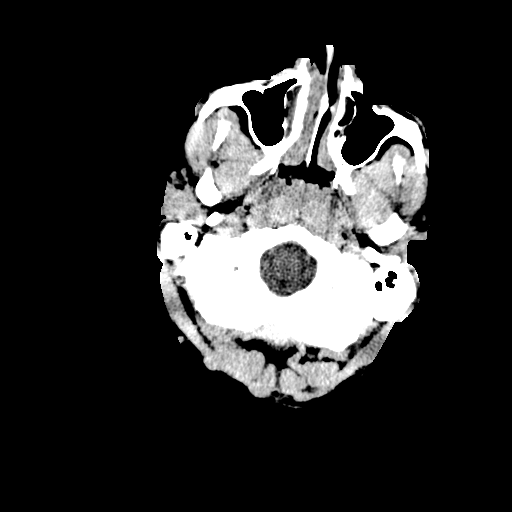
[im 2/36  bone]
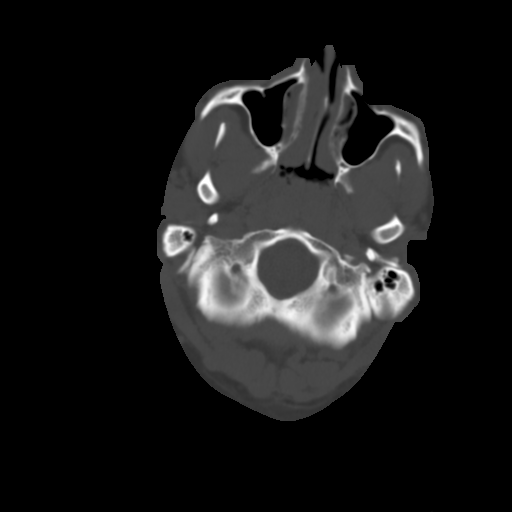
[im 4/36  brain]
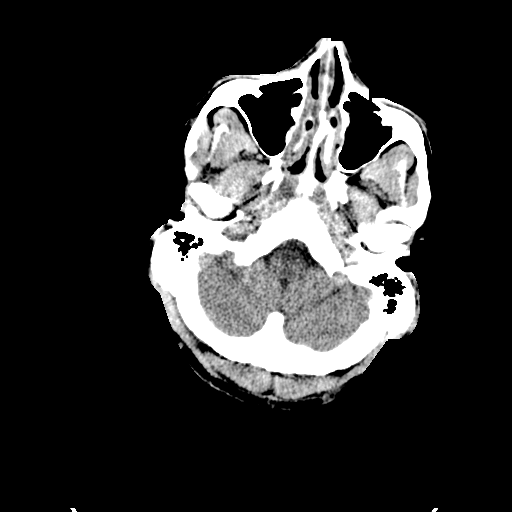
[im 7/36  brain]
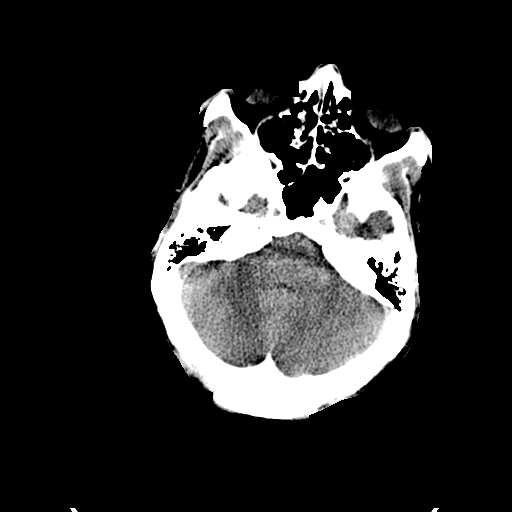
[im 9/36  brain]
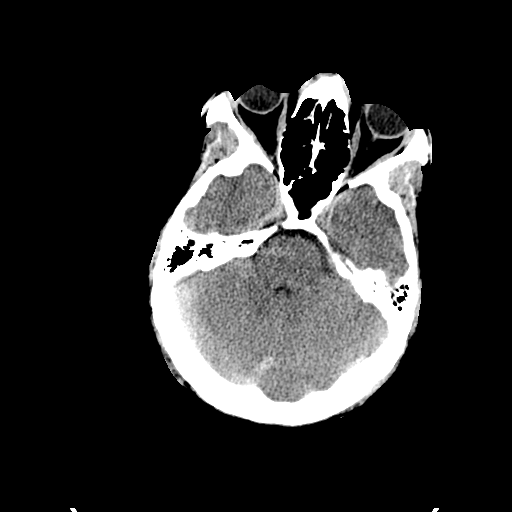
[im 10/36  brain]
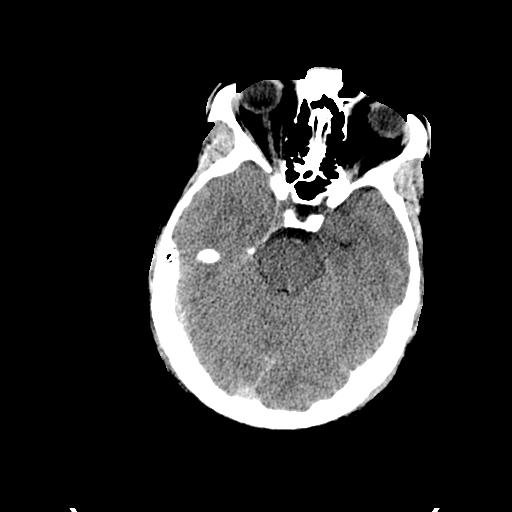
[im 10/36  bone]
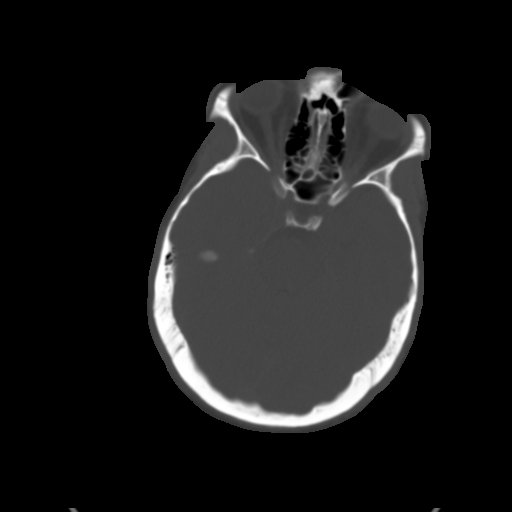
[im 13/36  brain]
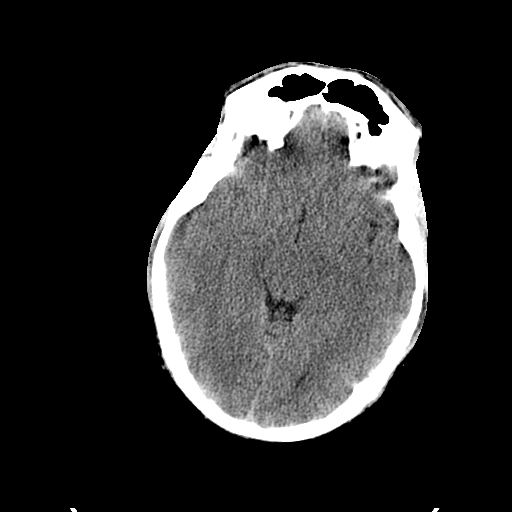
[im 15/36  brain]
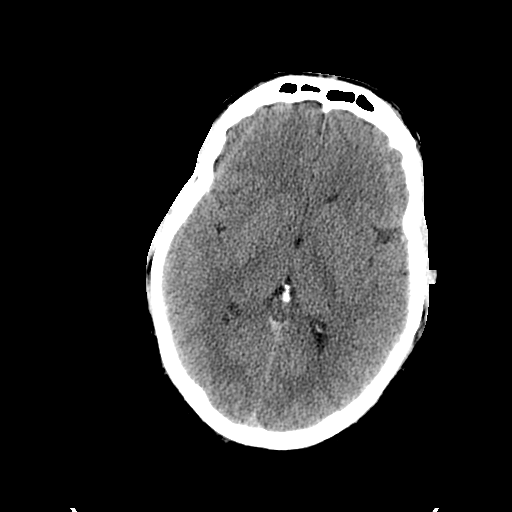
[im 17/36  brain]
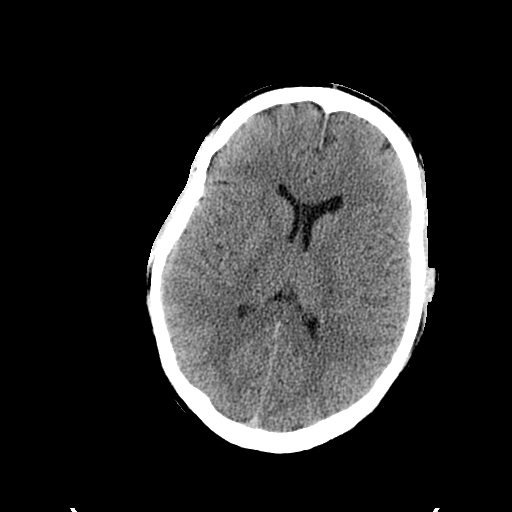
[im 19/36  brain]
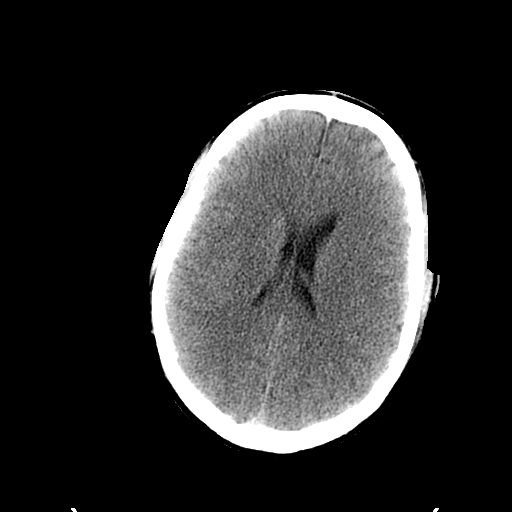
[im 19/36  bone]
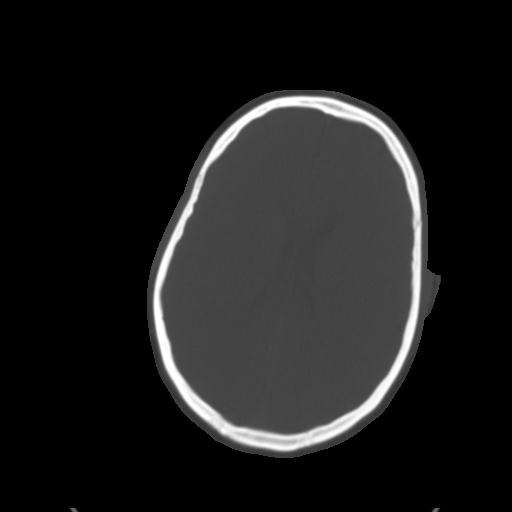
[im 21/36  brain]
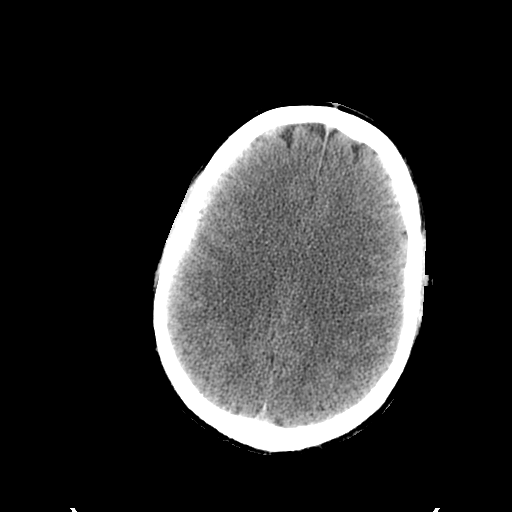
[im 23/36  brain]
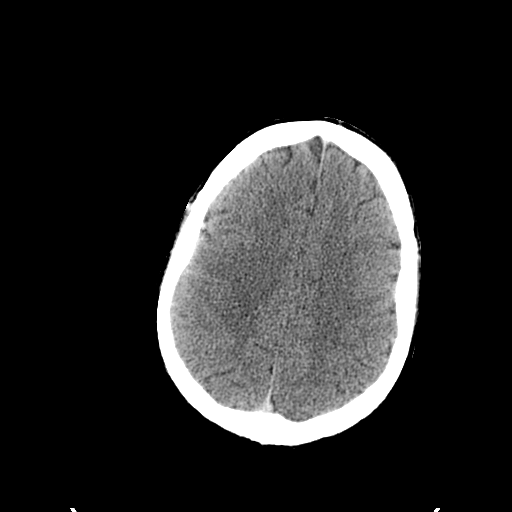
[im 26/36  brain]
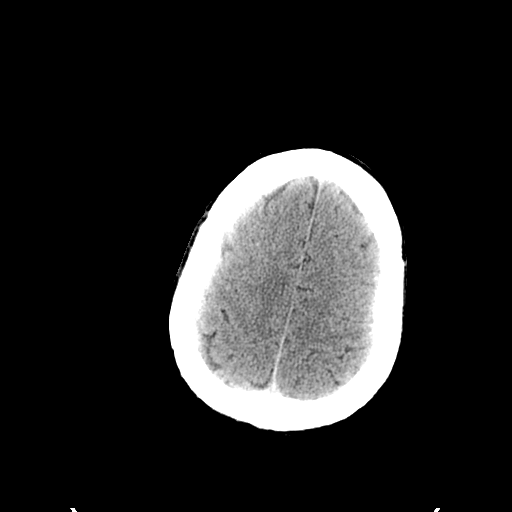
[im 27/36  brain]
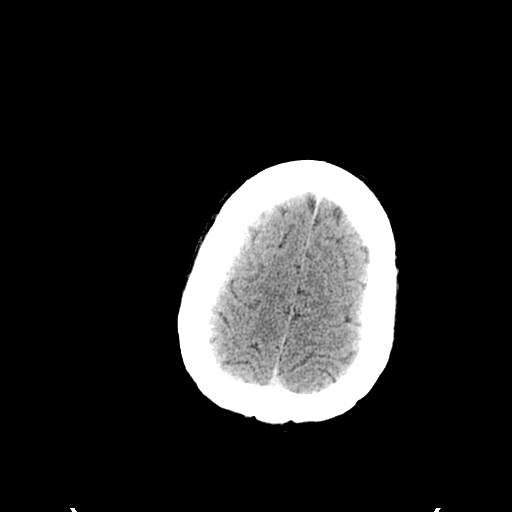
[im 27/36  bone]
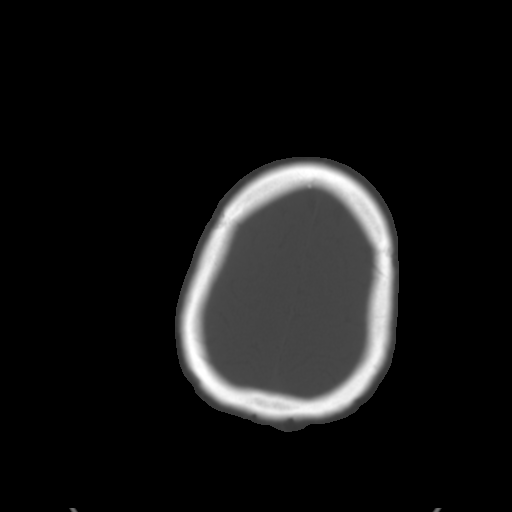
[im 29/36  brain]
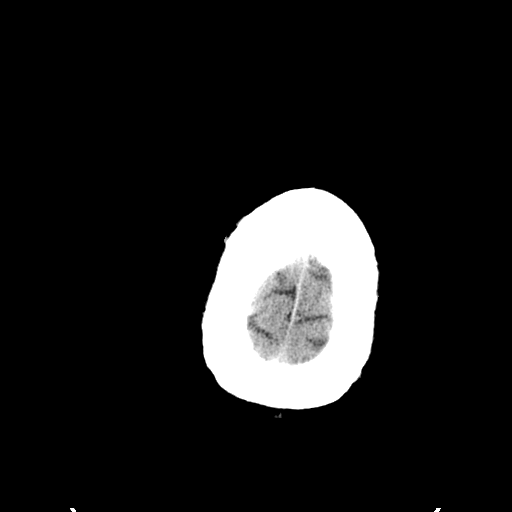
[im 32/36  brain]
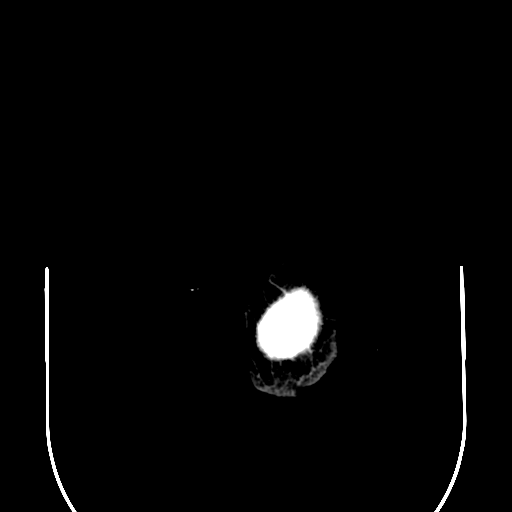
[im 34/36  brain]
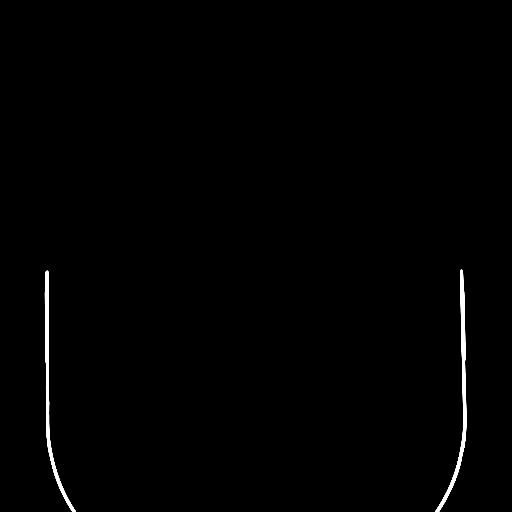

[16 of 30 positions shown; findings below may reference images not displayed]

FINDINGS: There is no intracranial hemorrhage, mass or evidence of acute
infarction. There is no extra-axial fluid collection. Gray matter
and white matter appear normal. Cerebral volume is normal for age.
Brainstem and posterior fossa are unremarkable. The CSF spaces
appear normal.

The bony structures are intact. There is mild membrane thickening in
the ethmoid air cells. Remainder of the visible sinuses are clear.
IMPRESSION: Normal brain
